# Patient Record
Sex: Female | Born: 1985 | Race: Black or African American | Hispanic: No | Smoking: Never smoker
Health system: Southern US, Community
[De-identification: ages and names within clinical notes are randomized; demographics above are authoritative.]

## PROBLEM LIST (undated history)

## (undated) DIAGNOSIS — D649 Anemia, unspecified: Secondary | ICD-10-CM

## (undated) DIAGNOSIS — R87629 Unspecified abnormal cytological findings in specimens from vagina: Secondary | ICD-10-CM

## (undated) DIAGNOSIS — I10 Essential (primary) hypertension: Secondary | ICD-10-CM

## (undated) DIAGNOSIS — E119 Type 2 diabetes mellitus without complications: Secondary | ICD-10-CM

## (undated) HISTORY — PX: INCISION AND DRAINAGE ABSCESS: SHX5864

## (undated) HISTORY — DX: Unspecified abnormal cytological findings in specimens from vagina: R87.629

## (undated) HISTORY — DX: Essential (primary) hypertension: I10

## (undated) HISTORY — PX: COLPOSCOPY W/ BIOPSY / CURETTAGE: SUR283

## (undated) HISTORY — DX: Anemia, unspecified: D64.9

---

## 2004-08-28 ENCOUNTER — Emergency Department (HOSPITAL_COMMUNITY): Admission: EM | Admit: 2004-08-28 | Discharge: 2004-08-29 | Payer: Self-pay | Admitting: Emergency Medicine

## 2006-08-01 ENCOUNTER — Encounter: Admission: RE | Admit: 2006-08-01 | Discharge: 2006-08-01 | Payer: Self-pay | Admitting: Cardiology

## 2006-12-10 ENCOUNTER — Emergency Department (HOSPITAL_COMMUNITY): Admission: EM | Admit: 2006-12-10 | Discharge: 2006-12-11 | Payer: Self-pay | Admitting: Emergency Medicine

## 2007-07-24 ENCOUNTER — Other Ambulatory Visit: Admission: RE | Admit: 2007-07-24 | Discharge: 2007-07-24 | Payer: Self-pay | Admitting: Gynecology

## 2008-02-26 ENCOUNTER — Ambulatory Visit: Payer: Self-pay | Admitting: Gynecology

## 2008-02-29 ENCOUNTER — Ambulatory Visit: Payer: Self-pay | Admitting: Gynecology

## 2008-03-12 ENCOUNTER — Inpatient Hospital Stay (HOSPITAL_COMMUNITY): Admission: AD | Admit: 2008-03-12 | Discharge: 2008-03-12 | Payer: Self-pay | Admitting: Obstetrics and Gynecology

## 2008-04-04 ENCOUNTER — Emergency Department (HOSPITAL_COMMUNITY): Admission: EM | Admit: 2008-04-04 | Discharge: 2008-04-04 | Payer: Self-pay | Admitting: Emergency Medicine

## 2008-10-03 ENCOUNTER — Encounter (INDEPENDENT_AMBULATORY_CARE_PROVIDER_SITE_OTHER): Payer: Self-pay | Admitting: Obstetrics and Gynecology

## 2008-10-03 ENCOUNTER — Inpatient Hospital Stay (HOSPITAL_COMMUNITY): Admission: RE | Admit: 2008-10-03 | Discharge: 2008-10-06 | Payer: Self-pay | Admitting: Obstetrics and Gynecology

## 2008-10-08 ENCOUNTER — Inpatient Hospital Stay (HOSPITAL_COMMUNITY): Admission: AD | Admit: 2008-10-08 | Discharge: 2008-10-12 | Payer: Self-pay | Admitting: Obstetrics and Gynecology

## 2009-05-06 ENCOUNTER — Ambulatory Visit: Payer: Self-pay | Admitting: Gynecology

## 2009-05-06 ENCOUNTER — Other Ambulatory Visit: Admission: RE | Admit: 2009-05-06 | Discharge: 2009-05-06 | Payer: Self-pay | Admitting: Gynecology

## 2009-06-15 ENCOUNTER — Ambulatory Visit: Payer: Self-pay | Admitting: Gynecology

## 2009-09-09 ENCOUNTER — Emergency Department (HOSPITAL_COMMUNITY): Admission: EM | Admit: 2009-09-09 | Discharge: 2009-09-09 | Payer: Self-pay | Admitting: Emergency Medicine

## 2009-09-17 ENCOUNTER — Ambulatory Visit: Payer: Self-pay | Admitting: Gynecology

## 2009-11-26 ENCOUNTER — Ambulatory Visit: Payer: Self-pay | Admitting: Gynecology

## 2010-05-12 ENCOUNTER — Other Ambulatory Visit
Admission: RE | Admit: 2010-05-12 | Discharge: 2010-05-12 | Payer: Self-pay | Source: Home / Self Care | Admitting: Gynecology

## 2010-05-12 ENCOUNTER — Ambulatory Visit
Admission: RE | Admit: 2010-05-12 | Discharge: 2010-05-12 | Payer: Self-pay | Source: Home / Self Care | Attending: Gynecology | Admitting: Gynecology

## 2010-05-24 ENCOUNTER — Ambulatory Visit: Admit: 2010-05-24 | Payer: Self-pay | Admitting: Gynecology

## 2010-05-31 ENCOUNTER — Ambulatory Visit: Admit: 2010-05-31 | Payer: Self-pay | Admitting: Gynecology

## 2010-06-07 ENCOUNTER — Encounter: Payer: Self-pay | Admitting: Gynecology

## 2010-08-09 LAB — CBC
HCT: 33.5 % — ABNORMAL LOW (ref 36.0–46.0)
HCT: 41.3 % (ref 36.0–46.0)
Hemoglobin: 11.4 g/dL — ABNORMAL LOW (ref 12.0–15.0)
Hemoglobin: 13 g/dL (ref 12.0–15.0)
Hemoglobin: 13.8 g/dL (ref 12.0–15.0)
MCHC: 33.4 g/dL (ref 30.0–36.0)
MCHC: 34 g/dL (ref 30.0–36.0)
MCHC: 34.2 g/dL (ref 30.0–36.0)
MCV: 87.9 fL (ref 78.0–100.0)
MCV: 88.4 fL (ref 78.0–100.0)
MCV: 89.8 fL (ref 78.0–100.0)
Platelets: 140 10*3/uL — ABNORMAL LOW (ref 150–400)
Platelets: 160 10*3/uL (ref 150–400)
RBC: 3.78 MIL/uL — ABNORMAL LOW (ref 3.87–5.11)
RBC: 3.8 MIL/uL — ABNORMAL LOW (ref 3.87–5.11)
RBC: 4.29 MIL/uL (ref 3.87–5.11)
RDW: 14.4 % (ref 11.5–15.5)
RDW: 14.5 % (ref 11.5–15.5)
WBC: 10 10*3/uL (ref 4.0–10.5)
WBC: 5.9 10*3/uL (ref 4.0–10.5)

## 2010-08-09 LAB — MAGNESIUM: Magnesium: 4.1 mg/dL — ABNORMAL HIGH (ref 1.5–2.5)

## 2010-08-09 LAB — DIFFERENTIAL
Basophils Relative: 0 % (ref 0–1)
Eosinophils Absolute: 0.1 10*3/uL (ref 0.0–0.7)
Eosinophils Relative: 2 % (ref 0–5)
Lymphs Abs: 1.2 10*3/uL (ref 0.7–4.0)
Neutro Abs: 4.2 10*3/uL (ref 1.7–7.7)
Neutrophils Relative %: 72 % (ref 43–77)

## 2010-08-09 LAB — COMPREHENSIVE METABOLIC PANEL
BUN: 7 mg/dL (ref 6–23)
CO2: 25 mEq/L (ref 19–32)
Calcium: 7.1 mg/dL — ABNORMAL LOW (ref 8.4–10.5)
Chloride: 108 mEq/L (ref 96–112)
GFR calc Af Amer: >60 mL/min (ref >60)
Glucose, Bld: 81 mg/dL (ref 70–99)
Total Bilirubin: 0.5 mg/dL (ref 0.3–1.2)
Total Protein: 5.6 g/dL — ABNORMAL LOW (ref 6.0–8.3)

## 2010-08-09 LAB — ALT: ALT: 27 U/L (ref 0–35)

## 2010-08-09 LAB — HERPES SIMPLEX VIRUS CULTURE: Culture: NOT DETECTED

## 2010-08-09 LAB — PLATELET COUNT: Platelets: 125 10*3/uL — ABNORMAL LOW (ref 150–400)

## 2010-08-09 LAB — CREATININE, SERUM
Creatinine, Ser: 0.59 mg/dL (ref 0.4–1.2)
Creatinine, Ser: 0.61 mg/dL (ref 0.4–1.2)

## 2010-08-09 LAB — CCBB MATERNAL DONOR DRAW

## 2010-08-09 LAB — URIC ACID: Uric Acid, Serum: 6.8 mg/dL (ref 2.4–7.0)

## 2010-09-14 NOTE — H&P (Signed)
NAMELAKYA, SCHRUPP            ACCOUNT NO.:  [id number]   MEDICAL RECORD NO.:  [id number]          PATIENT TYPE:  INP   LOCATION:  NA                            [facility]  [facility]   PHYSICIAN:  Charles A. Delcambre, MDDATE OF BIRTH:  1986-03-03   DATE OF ADMISSION:  DATE OF DISCHARGE:                              HISTORY & PHYSICAL   HISTORY OF PRESENT ILLNESS:  A 25 year old gravida 1, para 0 at 39 weeks  and 4 days will be admitted secondary to chronic hypertension requiring  2 medications and having elevated blood pressures, but normal pregnancy-  induced hypertension labs.  As she is over 39 weeks, we will commit her  to a serial induction given the patient to try to get her into labor.  This does not preclude that I would send her home and try further, but  with blood pressures increasing and on medication, I am not ready to do  that at this time.  She notes active fetal movement, denies  contractions, ruptured membranes, or bleeding.  She has no scotomata and  right upper quadrant pain, blurred vision or headaches.  Last 24-hour  urine was, as I recall, 228 mg and this was about a week ago.  PIH labs  1 week ago were negative.   PAST MEDICAL HISTORY:  Chronic hypertension, on blood pressure medicines  before the pregnancy.  Otherwise, no medical illnesses.  Baseline  proteinuria was 110 mg.   PAST SURGICAL HISTORY:  None.   MEDICATIONS:  1. Aldomet 500 mg q.i.d.  2. Labetalol 100 mg b.i.d.  3. Prenatal vitamins once a day.   ALLERGIES:  No known drug allergies.   SOCIAL HISTORY:  She does not smoke, drink alcohol, or drug use.  She is  not married.   FAMILY HISTORY:  Unrelated.   PHYSICAL EXAMINATION:  On June 1, she was 299 pounds, afebrile,  respirations 18, pulse 90.  Lungs were clear bilaterally.  Abdomen,  fundal height 42 with morbid obesity noted.  Fetal heart tones 150s on  nonstress test, as she has had biophysical profiles and biweekly  nonstress tests  since 32 weeks, all have been 10/10 and reactive.  She  has cervix posterior and closed.  She had ultrasound recently that  showed 79 percentile growth, 7 pounds 8 ounces, and this was on Sep 22, 2008.   LABORATORY FINDINGS:  A+, antibody screen negative, sickle cell trait  negative, VDRL nonreactive, rubella immune, hepatitis B surface antigen  negative, HIV negative, HIV repeated at 36 weeks negative, GC and  chlamydia early and late both negative, group B strep negative,  hemoglobin 12.3 at 28 weeks, RPR nonreactive at 28 weeks, 1-hour Glucola  99, quad screen and first-trimester screen normal, MSAFP at 16 weeks  only was negative.  RPR was nonreactive, HIV was nonreactive at 36  weeks.   ASSESSMENT:  Chronic hypertension at 39 weeks and 3 days, on multiple  medications.  She used to keep pressure down.   PLAN:  Serial induction, Cytotec versus Cervidil and patience, try to  get her into labor.  She is in  agreement and will report to the hospital  tonight at 7:30.      Charles A. Sydnee Cabal, MD  Electronically Signed     CAD/MEDQ  D:  10/02/2008  T:  10/03/2008  Job:  [id number]

## 2010-09-14 NOTE — Op Note (Signed)
NAMEBRYSTOL, Sara Moore            ACCOUNT NO.:  [id number]   MEDICAL RECORD NO.:  [id number]          PATIENT TYPE:  INP   LOCATION:  9130                          [facility]ILITY:  [facility]   PHYSICIAN:  Charles A. Delcambre, MDDATE OF BIRTH:  Dec 05, 1985   DATE OF PROCEDURE:  10/03/2008  DATE OF DISCHARGE:                               OPERATIVE REPORT   PREOPERATIVE DIAGNOSES:  1. Intrauterine pregnancy 39 weeks and 3 days.  2. Chronic hypertension.  3. Vulvar lesion suspicious for herpes simplex virus.   POSTOPERATIVE DIAGNOSES:  1. Intrauterine pregnancy 39 weeks and 3 days.  2. Chronic hypertension.  3. Vulvar lesion suspicious for herpes simplex virus.   PROCEDURE:  Primary low-transverse cesarean section.   SURGEON:  Charles A. Delcambre, MD   ASSISTANT:  None.   ANESTHESIA:  Epidural.   FINDINGS:  Vigorous female, Apgars 9 and 9.  Cord arterial blood gas pH  7.26.   SPECIMEN:  Placenta to pathology.  Normal uterus and ovaries and tubes  noted.   ESTIMATED BLOOD LOSS:  800 mL.   COMPLICATIONS:  None.   Instrument, sponge, and needle count correct x2.   DESCRIPTION OF PROCEDURE:  The patient was taken to the operating room  and placed in supine position and epidural was dosed and was adequate.  Sterile prep and drape was undertaken.  Pfannenstiel incision was made  with a knife, carried down to fascia.  Fascia was incised with a knife  and Mayo scissors.  The rectus sheath was released superiorly and  inferiorly sharply.  Muscles were separated in the midline by blunt  dissection.  Hemostat was used to enter the peritoneum.  There was no  damage to bowel and bladder or vascular structures and peritoneal  incision was taken superiorly and inferiorly.  Alexis retractor was  placed.  Hand was swept.  No structures were beneath the retractor  superiorly.  The retractor was then rolled down with good exposure.  Vesicouterine peritoneum was incised with Metzenbaum scissors.   Blunt  dissection was used to develop the bladder flap.  The lower uterine  segment was then incised transversally to amniotomy.  Light meconium was  noted and vertical traction was used to extend the incision.  Occiput  was brought to the abdominal opening and was a tight fit.  Bandage  scissors were used to cut beneath the Alexis retractor.  The fascia on  the right approximately a centimeter and this opened up the retractor  enough for the head to come through without difficulty.  Nares and mouth  were suctioned with bulb suction.  There was vigorous cry immediately.  Fundal pressure was applied.  The remainder of the baby was delivered  without difficulty.  Cord was clamped.  Infant was cut free, shown to  the parents, and handed off to the neonatologist in attendance, Dr.  Francine Graven.  Uterus was externalized for repair after the placenta was  manually expressed.  We will  have cord blood drawn.  The uterus was  wiped with a dry lap.  All membranous material was removed.  The uterus  was then closed  in a single layer with #1 chromic one figure-of-eight  suture was used for hemostasis at the right angle and one 2-0 Vicryl  figure-of-eight suture was used in an area of bleeding just below the  incision.  Hemostasis was excellent.  Irrigation was carried out.  Uterus was returned to the abdomen.  Irrigation was carried out again.  Hemostasis was verified.  Alexis retractor was removed.  Subfascial  hemostasis was excellent.  Fascia was then closed with #1 Vicryl running  nonlocking suture.  Subcutaneous hemostasis was excellent after minor  electrocautery.  Irrigation was carried out at this layer as well.  Skin  was then closed with sterile skin clips.  The patient tolerated the  procedure well and was taken to the recovery with physician in  attendance.      Charles A. Sydnee Cabal, MD  Electronically Signed     CAD/MEDQ  D:  10/03/2008  T:  10/04/2008  Job:  [id number]

## 2010-09-17 NOTE — Discharge Summary (Signed)
Sara Moore, Sara Moore            ACCOUNT NO.:  [id number]   MEDICAL RECORD NO.:  [id number]          PATIENT TYPE:  INP   LOCATION:  9130                          [facility]ILITY[facility]  [facility]   PHYSICIAN:  Charles A. Delcambre, MDDATE OF BIRTH:  1985-12-27   DATE OF ADMISSION:  10/03/2008  DATE OF DISCHARGE:  10/06/2008                               DISCHARGE SUMMARY   PRIMARY DISCHARGE DIAGNOSES:  1. Intrauterine pregnancy at 39 weeks and 3 days.  2. Chronic hypertension.  3. Vulvar lesion, possible herpes simplex virus outbreak.   POSTOPERATIVE DIAGNOSES:  1. Intrauterine pregnancy at 39 weeks and 3 days.  2. Chronic hypertension.  3. Vulvar lesion, possible herpes simplex virus outbreak.   PROCEDURE:  Primary low transverse cesarean section.   SURGEON:  Charles A. Delcambre, MD   ASSISTANT:  None.   OPERATIVE FINDINGS:  Vigorous female, Apgars 9 and 9, cord arterial  blood gas 7.26, placenta to Pathology.  Baby was 52.7 cm, 3570 g, vulvar  lesion culture ultimately returned, negative for HSV.  Placenta  pathology is not on chart at this time.   LABORATORY:  Preoperative hematocrit 41.3, hemoglobin 13.8, and  platelets 140.  Postoperative hematocrit 38.1 and hemoglobin 13.0.  Pregnancy-induced hypertension.  Labs were negative with ALT 21, LDH  138, AST 26, and creatinine 0.59.   HISTORY AND PHYSICAL:  Dictated on the chart.   HOSPITAL COURSE:  The patient was admitted for induction secondary to  worsening chronic hypertension with the patient have to be on Aldomet  and labetalol to control hypertension.  During the course of labor,  there was noted that she had a vulvar lesion and had a history of such  recurrent lesions, although she denied that this was a herpes lesion.  She had not been cultured for it in the past.  Therefore, at the  notation that this was possible herpes lesion.  I recommended cesarean  section.  She gave informed consent and cesarean section was  undertaken,  findings were noted above.  Postoperatively, she did well with  hypertension exacerbating somewhat and she had to be raised to 200 mg  labetalol b.i.d. in addition to the Aldomet 500 q.i.d.  She had no  headache, scotoma, or right upper quadrant pain and had routine  postoperative course, otherwise flatus returned on postop day #1.  Blood  pressure addressed further on day 2 with blood pressure 160/100,  continued to do well and no pressure stable 130/80s to 150/90s.  Known  she was chronic hypertension and known she was proteinuric with baseline  protein not in a severe range.  She was discharged home to follow up in the office in 2-3 days to  staples discontinued.   DISPOSITION:  She was given convalescent instructions, prescription of  Percocet 5/325 one to two p.o. q.4 h. p.r.n., #40 and Motrin 800 mg 1  p.o. q.8 h. p.r.n., #30, refill x1 were given with instructions no  lifting greater 25  pounds for 1 month.  Shower okay for 2 weeks.  No driving for 2 weeks.  No intercourse for 6 weeks, nothing in the vagina  for 6 weeks.  Notify  if temperature greater than 100 degrees, headache, scotoma, right upper  quadrant pain, erythema about the incision, pus draining, heavy  bleeding, or increased abdominal pain.      Charles A. Sydnee Cabal, MD  Electronically Signed     CAD/MEDQ  D:  10/25/2008  T:  10/25/2008  Job:  [id number]

## 2010-09-17 NOTE — Discharge Summary (Signed)
Sara Moore, Sara Moore            ACCOUNT NO.:  [id number]   MEDICAL RECORD NO.:  [id number]          PATIENT TYPE:  INP   LOCATION:  9373                          [facility]ILITY[facility]   PHYSICIAN:  Charles A. Delcambre, MDDATE OF BIRTH:  01/08/1986   DATE OF ADMISSION:  10/08/2008  DATE OF DISCHARGE:  10/12/2008                               DISCHARGE SUMMARY   PRIMARY DISCHARGE DIAGNOSES:  Pregnancy-augmented hypertension versus  severe preeclampsia.   PROCEDURE:  Admission to the AICU, given magnesium prophylaxis for 24  hours, management of hypertension, otherwise full rest hospitalization.   HISTORY AND PHYSICAL:  As dictated on the chart.   HOSPITAL COURSE:  The patient was admitted.  At the time of admission 5,  noted to have abnormal labs at the office with blood pressure  increasing, AST was 48 with normal up to 41, ALT was normal, uric acid  was 8.3, creatinine 0.7, platelets over 185.  She was admitted to  undergo magnesium prophylaxis for 24 hours.  She did undergo 4 g bolus  and then 2 g per hour IV mag, mag level was 4.7, it had been 4.1 earlier  in the evening.  She had blood pressures in the 150s/80-90s occasional  higher, max blood pressure was 174/110.  Urine output was 500, intake  was 500 since midnight.  On postop day #1, she had some crackles in her  lungs, right base, was breathing okay, sounds were okay, greater than 92  on 2 L per minute nasal cannula.  Repeat SGOT is 35, SGPT 33, creatinine  0.6, platelets 160.  She had been given 4 doses of IV labetalol 20 mg on  during the night to control her pressure.  She was increased to 400 mg  b.i.d. of labetalol in addition to the Aldomet 500 mg q.6 h.  She was  observed one more night in the ICU to control blood pressure.  Overnight, she was not given any IV labetalol.  I started Procardia XL  30 mg and discontinued the Aldomet, continued labetalol 400 mg, blood  pressures 161/87, 159/94.  Dr. Richardson Dopp saw the patient in  my absence and  increased Procardia ultimately to 90 mg, blood pressures were 179/91,  147/81.  On hospital day #3 postop day 9, she was discharged home on 90  mg of Procardia XL plus 400 mg labetalol b.i.d., the Procardia was once  a day.  She was to return to the office in 4-5 days to check blood  pressure once again, as blood pressures on this dose were 130s-150s over  60s-99.   DISPOSITION:  Return in 4-5 days after discharge home.  Per blood  pressure check, medications including Percocet and Motrin that she  already had plus labetalol that she already had, but increased to 400 mg  b.i.d. and a new prescription of Procardia XL 90 mg once a day #30.      Charles A. Sydnee Cabal, MD  Electronically Signed     CAD/MEDQ  D:  10/25/2008  T:  10/25/2008  Job:  [id number]

## 2011-07-07 ENCOUNTER — Encounter: Payer: Self-pay | Admitting: Gynecology

## 2013-01-31 ENCOUNTER — Ambulatory Visit: Payer: Self-pay | Admitting: Family Medicine

## 2013-02-04 ENCOUNTER — Ambulatory Visit: Payer: Self-pay

## 2013-02-14 ENCOUNTER — Ambulatory Visit: Payer: Self-pay | Admitting: Internal Medicine

## 2013-03-04 ENCOUNTER — Encounter: Payer: Self-pay | Admitting: Internal Medicine

## 2013-03-04 ENCOUNTER — Ambulatory Visit: Payer: Medicaid Other | Attending: Internal Medicine | Admitting: Internal Medicine

## 2013-03-04 ENCOUNTER — Other Ambulatory Visit (HOSPITAL_COMMUNITY)
Admission: RE | Admit: 2013-03-04 | Discharge: 2013-03-04 | Disposition: A | Discharge status: Home / Self Care | Payer: Medicaid Other | Source: Ambulatory Visit | Attending: Internal Medicine | Admitting: Internal Medicine

## 2013-03-04 VITALS — BP 150/90 | HR 129 | Temp 99.4°F | Resp 16 | Ht 68.0 in | Wt 303.0 lb

## 2013-03-04 DIAGNOSIS — I10 Essential (primary) hypertension: Secondary | ICD-10-CM | POA: Insufficient documentation

## 2013-03-04 DIAGNOSIS — N760 Acute vaginitis: Secondary | ICD-10-CM | POA: Insufficient documentation

## 2013-03-04 DIAGNOSIS — E785 Hyperlipidemia, unspecified: Secondary | ICD-10-CM | POA: Insufficient documentation

## 2013-03-04 DIAGNOSIS — N898 Other specified noninflammatory disorders of vagina: Secondary | ICD-10-CM | POA: Insufficient documentation

## 2013-03-04 DIAGNOSIS — Z01419 Encounter for gynecological examination (general) (routine) without abnormal findings: Secondary | ICD-10-CM | POA: Insufficient documentation

## 2013-03-04 DIAGNOSIS — Z124 Encounter for screening for malignant neoplasm of cervix: Secondary | ICD-10-CM | POA: Insufficient documentation

## 2013-03-04 DIAGNOSIS — Z113 Encounter for screening for infections with a predominantly sexual mode of transmission: Secondary | ICD-10-CM | POA: Insufficient documentation

## 2013-03-04 DIAGNOSIS — R1032 Left lower quadrant pain: Secondary | ICD-10-CM

## 2013-03-04 LAB — CMP AND LIVER
ALT: 15 U/L (ref 0–35)
AST: 14 U/L (ref 0–37)
Albumin: 3.9 g/dL (ref 3.5–5.2)
Alkaline Phosphatase: 38 U/L — ABNORMAL LOW (ref 39–117)
Glucose, Bld: 87 mg/dL (ref 70–99)
Potassium: 3.8 mEq/L (ref 3.5–5.3)
Sodium: 143 mEq/L (ref 135–145)
Total Protein: 6.9 g/dL (ref 6.0–8.3)

## 2013-03-04 LAB — CBC WITH DIFFERENTIAL/PLATELET
Basophils Absolute: 0 10*3/uL (ref 0.0–0.1)
Basophils Relative: 0 % (ref 0–1)
Eosinophils Relative: 2 % (ref 0–5)
HCT: 33.1 % — ABNORMAL LOW (ref 36.0–46.0)
MCH: 25.8 pg — ABNORMAL LOW (ref 26.0–34.0)
MCHC: 32.3 g/dL (ref 30.0–36.0)
MCV: 80 fL (ref 78.0–100.0)
Monocytes Absolute: 0.6 10*3/uL (ref 0.1–1.0)
RDW: 15.8 % — ABNORMAL HIGH (ref 11.5–15.5)

## 2013-03-04 LAB — LIPID PANEL
LDL Cholesterol: 107 mg/dL — ABNORMAL HIGH (ref 0–99)
VLDL: 21 mg/dL (ref 0–40)

## 2013-03-04 LAB — TSH: TSH: 0.359 u[IU]/mL (ref 0.350–4.500)

## 2013-03-04 NOTE — Progress Notes (Signed)
Patient ID: Sara Moore, female   DOB: Mar 06, 1986, 27 y.o.   MRN: [id number] Patient Demographics  Sara Moore, is a 27 y.o. female  WUJ:[id number]  NFA:[id number]  DOB - 08/29/1985  CC:  Chief Complaint  Patient presents with  . Establish Care       HPI: Sara Moore is a 27 y.o. female here today to establish medical care. She has no significant past medical history. Major complaint today is vaginal discharge and lower abdominal pain and heaviness. She is not known to have hypertension or diabetes but it is family history of both. She is married, has a daughter 60 years old. She does not smoke cigarette, she does not drink alcohol. But she smokes marijuana daily.  Patient has No headache, No chest pain, - No Nausea, No new weakness tingling or numbness, No Cough - SOB.  No Known Allergies History reviewed. No pertinent past medical history. No current outpatient prescriptions on file prior to visit.   No current facility-administered medications on file prior to visit.   Family History  Problem Relation Age of Onset  . Hypertension Mother   . Diabetes Father   . Hypertension Father    History   Social History  . Marital Status: Single    Spouse Name: N/A    Number of Children: N/A  . Years of Education: N/A   Occupational History  . Not on file.   Social History Main Topics  . Smoking status: Never Smoker   . Smokeless tobacco: Not on file  . Alcohol Use: No  . Drug Use: 7.00 per week    Special: Marijuana  . Sexual Activity: Not on file   Other Topics Concern  . Not on file   Social History Narrative  . No narrative on file    Review of Systems: Constitutional: Negative for fever, chills, diaphoresis, activity change, appetite change and fatigue. HENT: Negative for ear pain, nosebleeds, congestion, facial swelling, rhinorrhea, neck pain, neck stiffness and ear discharge.  Eyes: Negative for pain, discharge, redness, itching and visual  disturbance. Respiratory: Negative for cough, choking, chest tightness, shortness of breath, wheezing and stridor.  Cardiovascular: Negative for chest pain, palpitations and leg swelling. Gastrointestinal: Negative for abdominal distention. Genitourinary: Negative for dysuria, urgency, frequency, hematuria, flank pain, decreased urine volume, difficulty urinating and dyspareunia.  Musculoskeletal: Negative for back pain, joint swelling, arthralgia and gait problem. Neurological: Negative for dizziness, tremors, seizures, syncope, facial asymmetry, speech difficulty, weakness, light-headedness, numbness and headaches.  Hematological: Negative for adenopathy. Does not bruise/bleed easily. Psychiatric/Behavioral: Negative for hallucinations, behavioral problems, confusion, dysphoric mood, decreased concentration and agitation.    Objective:   Filed Vitals:   03/04/13 1418  BP: 161/107  Pulse: 129  Temp: 99.4 F (37.4 C)  Resp: 16    Physical Exam: Constitutional: Patient appears well-developed and well-nourished. No distress. Morbidly obese HENT: Normocephalic, atraumatic, External right and left ear normal. Oropharynx is clear and moist.  Eyes: Conjunctivae and EOM are normal. PERRLA, no scleral icterus. Neck: Normal ROM. Neck supple. No JVD. No tracheal deviation. No thyromegaly. CVS: RRR, S1/S2 +, no murmurs, no gallops, no carotid bruit.  Pulmonary: Effort and breath sounds normal, no stridor, rhonchi, wheezes, rales.  Abdominal: Soft. BS +, no distension, tenderness, rebound or guarding.  Musculoskeletal: Normal range of motion. No edema and no tenderness.  Lymphadenopathy: No lymphadenopathy noted, cervical, inguinal or axillary Neuro: Alert. Normal reflexes, muscle tone coordination. No cranial nerve deficit. Skin: Skin is warm and dry. No  rash noted. Not diaphoretic. No erythema. No pallor. Psychiatric: Normal mood and affect. Behavior, judgment, thought content normal. Pelvic  exam: Normal female external genitalia, copious vaginal discharge, foul smelling, central cervix, cervical excitation tenderness negative  Lab Results  Component Value Date   WBC 6.6 10/10/2008   HGB 11.4* 10/10/2008   HCT 33.4* 10/10/2008   MCV 88.4 10/10/2008   PLT 184 10/10/2008   Lab Results  Component Value Date   CREATININE 0.61 10/10/2008   BUN 7 10/09/2008   NA 139 10/09/2008   K 3.3* 10/09/2008   CL 108 10/09/2008   CO2 25 10/09/2008    No results found for this basename: HGBA1C   Lipid Panel  No results found for this basename: chol, trig, hdl, cholhdl, vldl, ldlcalc       Assessment and plan:   Patient Active Problem List   Diagnosis Date Noted  . Vaginal discharge 03/04/2013  . Pap smear for cervical cancer screening 03/04/2013  . Accelerated hypertension 03/04/2013  . Dyslipidemia 03/04/2013  . Morbid obesity 03/04/2013  . Abdominal pain, left lower quadrant 03/04/2013   Repeat blood pressure today was 150/90.  Plan: Patient is not known to have hypertension, she has been instructed to keep a log of ambulatory blood pressure and bring to clinic in one to 2 weeks, then we will decide on antihypertensive. Otherwise she was counseled on nutrition and exercise   Pap smear done, sent for cytology, culture and wet mount Pelvic and transvaginal ultrasound ordered  Labs: CBC D. CMP TSH and free T4 Lipid panel Complete urinalysis Hemoglobin A1c  Follow up in 3 months or when necessary , but return in 2 weeks for blood pressure check   The patient was given clear instructions to go to ER or return to medical center if symptoms don't improve, worsen or new problems develop. The patient verbalized understanding. The patient was told to call to get lab results if they haven't heard anything in the next week.     Jeanann Lewandowsky, MD, MHA, FACP, FAAP [facility] [place], [place] [phone]   03/04/2013, 3:09 PM

## 2013-03-04 NOTE — Patient Instructions (Signed)
Obesity Obesity is defined as having too much total body fat and a body mass index (BMI) of 30 or more. BMI is an estimate of body fat and is calculated from your height and weight. Obesity happens when you consume more calories than you can burn by exercising or performing daily physical tasks. Prolonged obesity can cause major illnesses or emergencies, such as:   A stroke.  Heart disease.  Diabetes.  Cancer.  Arthritis.  High blood pressure (hypertension).  High cholesterol.  Sleep apnea.  Erectile dysfunction.  Infertility problems. CAUSES   Regularly eating unhealthy foods.  Physical inactivity.  Certain disorders, such as an underactive thyroid (hypothyroidism), Cushing's syndrome, and polycystic ovarian syndrome.  Certain medicines, such as steroids, some depression medicines, and antipsychotics.  Genetics.  Lack of sleep. DIAGNOSIS  A caregiver can diagnose obesity after calculating your BMI. Obesity will be diagnosed if your BMI is 30 or higher.  There are other methods of measuring obesity levels. Some other methods include measuring your skin fold thickness, your waist circumference, and comparing your hip circumference to your waist circumference. TREATMENT  A healthy treatment program includes some or all of the following:  Long-term dietary changes.  Exercise and physical activity.  Behavioral and lifestyle changes.  Medicine only under the supervision of your caregiver. Medicines may help, but only if they are used with diet and exercise programs. An unhealthy treatment program includes:  Fasting.  Fad diets.  Supplements and drugs. These choices do not succeed in long-term weight control.  HOME CARE INSTRUCTIONS   Exercise and perform physical activity as directed by your caregiver. To increase physical activity, try the following:  Use stairs instead of elevators.  Park farther away from store entrances.  Garden, bike, or walk instead of  watching television or using the computer.  Eat healthy, low-calorie foods and drinks on a regular basis. Eat more fruits and vegetables. Use low-calorie cookbooks or take healthy cooking classes.  Limit fast food, sweets, and processed snack foods.  Eat smaller portions.  Keep a daily journal of everything you eat. There are many free websites to help you with this. It may be helpful to measure your foods so you can determine if you are eating the correct portion sizes.  Avoid drinking alcohol. Drink more water and drinks without calories.  Take vitamins and supplements only as recommended by your caregiver.  Weight-loss support groups, Optometrist, counselors, and stress reduction education can also be very helpful. SEEK IMMEDIATE MEDICAL CARE IF:  You have chest pain or tightness.  You have trouble breathing or feel short of breath.  You have weakness or leg numbness.  You feel confused or have trouble talking.  You have sudden changes in your vision. MAKE SURE YOU:  Understand these instructions.  Will watch your condition.  Will get help right away if you are not doing well or get worse. Document Released: 05/26/2004 Document Revised: 10/18/2011 Document Reviewed: 05/25/2011 [facility] Patient Information 2014 [place], [place]. DASH Diet The DASH diet stands for "Dietary Approaches to Stop Hypertension." It is a healthy eating plan that has been shown to reduce high blood pressure (hypertension) in as little as 14 days, while also possibly providing other significant health benefits. These other health benefits include reducing the risk of breast cancer after menopause and reducing the risk of type 2 diabetes, heart disease, colon cancer, and stroke. Health benefits also include weight loss and slowing kidney failure in patients with chronic kidney disease.  DIET GUIDELINES  Limit salt (sodium). Your diet should contain less than 1500 mg of sodium daily.  Limit  refined or processed carbohydrates. Your diet should include mostly whole grains. Desserts and added sugars should be used sparingly.  Include small amounts of heart-healthy fats. These types of fats include nuts, oils, and tub margarine. Limit saturated and trans fats. These fats have been shown to be harmful in the body. CHOOSING FOODS  The following food groups are based on a 2000 calorie diet. See your Registered Dietitian for individual calorie needs. Grains and Grain Products (6 to 8 servings daily)  Eat More Often: Whole-wheat bread, brown rice, whole-grain or wheat pasta, quinoa, popcorn without added fat or salt (air popped).  Eat Less Often: White bread, white pasta, white rice, cornbread. Vegetables (4 to 5 servings daily)  Eat More Often: Fresh, frozen, and canned vegetables. Vegetables may be raw, steamed, roasted, or grilled with a minimal amount of fat.  Eat Less Often/Avoid: Creamed or fried vegetables. Vegetables in a cheese sauce. Fruit (4 to 5 servings daily)  Eat More Often: All fresh, canned (in natural juice), or frozen fruits. Dried fruits without added sugar. One hundred percent fruit juice ( cup [237 mL] daily).  Eat Less Often: Dried fruits with added sugar. Canned fruit in light or heavy syrup. [employer], Fish, and Poultry (2 servings or less daily. One serving is 3 to 4 oz [85-114 g]).  Eat More Often: Ninety percent or leaner ground beef, tenderloin, sirloin. Round cuts of beef, chicken breast, [place] breast. All fish. Grill, bake, or broil your meat. Nothing should be fried.  Eat Less Often/Avoid: Fatty cuts of meat, [place], or chicken leg, thigh, or wing. Fried cuts of meat or fish. Dairy (2 to 3 servings)  Eat More Often: Low-fat or fat-free milk, low-fat plain or light yogurt, reduced-fat or part-skim cheese.  Eat Less Often/Avoid: Milk (whole, 2%).Whole milk yogurt. Full-fat cheeses. Nuts, Seeds, and Legumes (4 to 5 servings per week)  Eat More  Often: All without added salt.  Eat Less Often/Avoid: Salted nuts and seeds, canned beans with added salt. Fats and Sweets (limited)  Eat More Often: Vegetable oils, tub margarines without trans fats, sugar-free gelatin. Mayonnaise and salad dressings.  Eat Less Often/Avoid: Coconut oils, palm oils, butter, stick margarine, cream, half and half, cookies, candy, pie. FOR MORE INFORMATION The Dash Diet Eating Plan: www.dashdiet.org Document Released: 04/07/2011 Document Revised: 07/11/2011 Document Reviewed: 04/07/2011 [facility] Patient Information 2014 [place], [place]. Hypertension As your heart beats, it forces blood through your arteries. This force is your blood pressure. If the pressure is too high, it is called hypertension (HTN) or high blood pressure. HTN is dangerous because you may have it and not know it. High blood pressure may mean that your heart has to work harder to pump blood. Your arteries may be narrow or stiff. The extra work puts you at risk for heart disease, stroke, and other problems.  Blood pressure consists of two numbers, a higher number over a lower, 110/72, for example. It is stated as "110 over 72." The ideal is below 120 for the top number (systolic) and under 80 for the bottom (diastolic). Write down your blood pressure today. You should pay close attention to your blood pressure if you have certain conditions such as:  Heart failure.  Prior heart attack.  Diabetes  Chronic kidney disease.  Prior stroke.  Multiple risk factors for heart disease. To see if you have HTN, your blood pressure should be  measured while you are seated with your arm held at the level of the heart. It should be measured at least twice. A one-time elevated blood pressure reading (especially in the Emergency Department) does not mean that you need treatment. There may be conditions in which the blood pressure is different between your right and left arms. It is important to see your  caregiver soon for a recheck. Most people have essential hypertension which means that there is not a specific cause. This type of high blood pressure may be lowered by changing lifestyle factors such as:  Stress.  Smoking.  Lack of exercise.  Excessive weight.  Drug/tobacco/alcohol use.  Eating less salt. Most people do not have symptoms from high blood pressure until it has caused damage to the body. Effective treatment can often prevent, delay or reduce that damage. TREATMENT  When a cause has been identified, treatment for high blood pressure is directed at the cause. There are a large number of medications to treat HTN. These fall into several categories, and your caregiver will help you select the medicines that are best for you. Medications may have side effects. You should review side effects with your caregiver. If your blood pressure stays high after you have made lifestyle changes or started on medicines,   Your medication(s) may need to be changed.  Other problems may need to be addressed.  Be certain you understand your prescriptions, and know how and when to take your medicine.  Be sure to follow up with your caregiver within the time frame advised (usually within two weeks) to have your blood pressure rechecked and to review your medications.  If you are taking more than one medicine to lower your blood pressure, make sure you know how and at what times they should be taken. Taking two medicines at the same time can result in blood pressure that is too low. SEEK IMMEDIATE MEDICAL CARE IF:  You develop a severe headache, blurred or changing vision, or confusion.  You have unusual weakness or numbness, or a faint feeling.  You have severe chest or abdominal pain, vomiting, or breathing problems. MAKE SURE YOU:   Understand these instructions.  Will watch your condition.  Will get help right away if you are not doing well or get worse. Document Released:  04/18/2005 Document Revised: 07/11/2011 Document Reviewed: 12/07/2007 [facility] Patient Information 2014 [place], [place]. Exercise to Lose Weight Exercise and a healthy diet may help you lose weight. Your doctor may suggest specific exercises. EXERCISE IDEAS AND TIPS  Choose low-cost things you enjoy doing, such as walking, bicycling, or exercising to workout videos.  Take stairs instead of the elevator.  Walk during your lunch break.  Park your car further away from work or school.  Go to a gym or an exercise class.  Start with 5 to 10 minutes of exercise each day. Build up to 30 minutes of exercise 4 to 6 days a week.  Wear shoes with good support and comfortable clothes.  Stretch before and after working out.  Work out until you breathe harder and your heart beats faster.  Drink extra water when you exercise.  Do not do so much that you hurt yourself, feel dizzy, or get very short of breath. Exercises that burn about 150 calories:  Running 1  miles in 15 minutes.  Playing volleyball for 45 to 60 minutes.  Washing and waxing a car for 45 to 60 minutes.  Playing touch football for 45 minutes.  Walking  1  miles in 35 minutes.  Pushing a stroller 1  miles in 30 minutes.  Playing basketball for 30 minutes.  Raking leaves for 30 minutes.  Bicycling 5 miles in 30 minutes.  Walking 2 miles in 30 minutes.  Dancing for 30 minutes.  Shoveling snow for 15 minutes.  Swimming laps for 20 minutes.  Walking up stairs for 15 minutes.  Bicycling 4 miles in 15 minutes.  Gardening for 30 to 45 minutes.  Jumping rope for 15 minutes.  Washing windows or floors for 45 to 60 minutes. Document Released: 05/21/2010 Document Revised: 07/11/2011 Document Reviewed: 05/21/2010 [facility] Patient Information 2014 [place], [place].

## 2013-03-04 NOTE — Progress Notes (Signed)
Pt is here to establish care. For over a month pt has been having stomach pains. Some constipation but regular and consistent stool.  For 4 months pt is having discharge, creamy, yellow, clear, with an odor.

## 2013-03-05 LAB — URINALYSIS, COMPLETE
Bilirubin Urine: NEGATIVE
Hgb urine dipstick: NEGATIVE
Leukocytes, UA: NEGATIVE
Protein, ur: NEGATIVE mg/dL
Urobilinogen, UA: 0.2 mg/dL (ref 0.0–1.0)

## 2013-03-06 ENCOUNTER — Ambulatory Visit (HOSPITAL_COMMUNITY)
Admission: RE | Admit: 2013-03-06 | Discharge: 2013-03-06 | Disposition: A | Discharge status: Home / Self Care | Payer: Medicaid Other | Source: Ambulatory Visit | Attending: Internal Medicine | Admitting: Internal Medicine

## 2013-03-06 DIAGNOSIS — N898 Other specified noninflammatory disorders of vagina: Secondary | ICD-10-CM | POA: Insufficient documentation

## 2013-03-06 DIAGNOSIS — R1032 Left lower quadrant pain: Secondary | ICD-10-CM | POA: Insufficient documentation

## 2013-03-06 DIAGNOSIS — N83209 Unspecified ovarian cyst, unspecified side: Secondary | ICD-10-CM | POA: Insufficient documentation

## 2013-03-08 ENCOUNTER — Other Ambulatory Visit: Payer: Self-pay | Admitting: Internal Medicine

## 2013-03-08 ENCOUNTER — Telehealth: Payer: Self-pay | Admitting: Emergency Medicine

## 2013-03-08 DIAGNOSIS — N83209 Unspecified ovarian cyst, unspecified side: Secondary | ICD-10-CM

## 2013-03-08 MED ORDER — METRONIDAZOLE 500 MG PO TABS: ORAL_TABLET | ORAL | Status: DC

## 2013-03-08 NOTE — Telephone Encounter (Signed)
Message copied by Darlis Loan on Fri Mar 08, 2013  2:17 PM ------      Message from: Quentin Angst      Created: Fri Mar 08, 2013 12:27 PM       Please inform patient that her ultrasound shows Bilateral complex/ hemorrhagic ovarian cyst. Normal uterus and endometrium.. We will refer her to a gynecologist for further evaluation and management       ------

## 2013-03-08 NOTE — Telephone Encounter (Signed)
Pt given u/s results. Pt also prescribed Flagyl 500 mg bid x 7 dys with treatment for spouse. Will refer to gynecology

## 2013-03-15 ENCOUNTER — Telehealth: Payer: Self-pay | Admitting: Emergency Medicine

## 2013-03-15 NOTE — Telephone Encounter (Signed)
Message copied by Darlis Loan on Fri Mar 15, 2013  6:41 PM ------      Message from: Quentin Angst      Created: Fri Mar 15, 2013  4:33 PM       Please tell patient that her hemoglobin is slightly low. Recommend over-the-counter iron supplement. Patient already know the result of her Pap smear been normal and a vaginal swab test being positive for Trichomonas and Gardnerella, treated with Flagyl. ------

## 2013-03-15 NOTE — Telephone Encounter (Signed)
Pt called with results and told to complete prescribed medication

## 2013-03-20 ENCOUNTER — Telehealth: Payer: Self-pay | Admitting: Internal Medicine

## 2013-03-20 NOTE — Telephone Encounter (Signed)
Patient lost one pill. She was wondering if it was okay to miss a dose?

## 2013-04-04 ENCOUNTER — Ambulatory Visit: Payer: Medicaid Other

## 2013-06-10 ENCOUNTER — Telehealth: Payer: Self-pay

## 2013-06-10 NOTE — Telephone Encounter (Signed)
Pt. Called stating she has been waiting to be seen for a cyst since October and wants to know when she has an appointment. Called pt. And left message informing her of appointment scheduled for this Friday 06/14/13 at 1030. Pt. Can call clinic if questions.

## 2013-06-11 ENCOUNTER — Telehealth: Payer: Self-pay | Admitting: *Deleted

## 2013-06-11 NOTE — Telephone Encounter (Signed)
Pt called nurse line requesting information on when she would get an appointment to be seen for bleeding, cyst on ovary,.

## 2013-06-13 ENCOUNTER — Ambulatory Visit (HOSPITAL_COMMUNITY): Payer: Medicaid Other

## 2013-06-13 ENCOUNTER — Telehealth: Payer: Self-pay | Admitting: *Deleted

## 2013-06-13 DIAGNOSIS — N83209 Unspecified ovarian cyst, unspecified side: Secondary | ICD-10-CM

## 2013-06-13 NOTE — Telephone Encounter (Signed)
Attempted to call patient. No lines operating, pt scheduled for ultrasound on 02/13 at 1015, then will schedule f/u appt.  Appts set up for patient.

## 2013-06-13 NOTE — Telephone Encounter (Signed)
Called Sara Moore and notified her of appointments for ultrasound and follow up/results.  Sara Moore voices understanding.

## 2013-06-13 NOTE — Telephone Encounter (Signed)
Message copied by Dorothyann PengHAIZLIP, Josilynn E on Thu Jun 13, 2013 11:25 AM ------      Message from: Adam PhenixARNOLD, JAMES G      Created: Tue Jun 11, 2013  5:04 PM       Referred for f/u US 03/2003 hemorrhagic ovarian cysts. Recommend f/u pelvic US be done prior to being seen in gyn clinic, may reschedule appt if needed  ------

## 2013-06-13 NOTE — Telephone Encounter (Signed)
Message copied by Dorothyann PengHAIZLIP, Marka E on Thu Jun 13, 2013  8:23 AM ------      Message from: Adam PhenixARNOLD, JAMES G      Created: Tue Jun 11, 2013  5:04 PM       Referred for f/u US 03/2003 hemorrhagic ovarian cysts. Recommend f/u pelvic US be done prior to being seen in gyn clinic, may reschedule appt if needed  ------

## 2013-06-14 ENCOUNTER — Encounter: Payer: Medicaid Other | Admitting: Obstetrics & Gynecology

## 2013-06-14 ENCOUNTER — Other Ambulatory Visit: Payer: Self-pay | Admitting: Obstetrics & Gynecology

## 2013-06-14 ENCOUNTER — Ambulatory Visit (HOSPITAL_COMMUNITY)
Admission: RE | Admit: 2013-06-14 | Discharge: 2013-06-14 | Disposition: A | Discharge status: Home / Self Care | Payer: Medicaid Other | Source: Ambulatory Visit | Attending: Obstetrics & Gynecology | Admitting: Obstetrics & Gynecology

## 2013-06-14 DIAGNOSIS — N949 Unspecified condition associated with female genital organs and menstrual cycle: Secondary | ICD-10-CM | POA: Insufficient documentation

## 2013-06-14 DIAGNOSIS — N83209 Unspecified ovarian cyst, unspecified side: Secondary | ICD-10-CM

## 2013-06-14 DIAGNOSIS — N831 Corpus luteum cyst of ovary, unspecified side: Secondary | ICD-10-CM | POA: Insufficient documentation

## 2013-06-17 ENCOUNTER — Telehealth: Payer: Self-pay

## 2013-06-17 NOTE — Telephone Encounter (Signed)
Message copied by Louanna RawAMPBELL, Avian Greenawalt M on Mon Jun 17, 2013 10:32 AM ------      Message from: Adam PhenixARNOLD, JAMES G      Created: Fri Jun 14, 2013 12:10 PM       Normal US has clinic appt, notify patient normal result ------

## 2013-06-17 NOTE — Telephone Encounter (Signed)
Pt. Informed. Reminded of appointment date and time. Pt. Had no questions or concerns.

## 2013-07-08 ENCOUNTER — Encounter: Payer: Self-pay | Admitting: Obstetrics & Gynecology

## 2013-07-08 ENCOUNTER — Ambulatory Visit (INDEPENDENT_AMBULATORY_CARE_PROVIDER_SITE_OTHER): Payer: Medicaid Other | Admitting: Obstetrics & Gynecology

## 2013-07-08 VITALS — BP 142/98 | HR 88 | Temp 98.3°F | Ht 60.0 in | Wt 300.5 lb

## 2013-07-08 DIAGNOSIS — N83209 Unspecified ovarian cyst, unspecified side: Secondary | ICD-10-CM

## 2013-07-08 NOTE — Progress Notes (Signed)
Patient ID: Sara SavageCandace S Moore, female   DOB: 07/28/1985, 28 y.o.   MRN: [id number]  Chief Complaint  Patient presents with  . Follow-up    discuss ultrasound results from 2/13    HPI Sara Moore is a 28 y.o. female.  [id number] Patient's last menstrual period was 06/22/2013. Referred for f/u of hemorrhagic ovarian cyst, US done 06/14/13  HPI  History reviewed. No pertinent past medical history.  Past Surgical History  Procedure Laterality Date  . Cesarean section      Family History  Problem Relation Age of Onset  . Hypertension Mother   . Diabetes Father   . Hypertension Father     Social History History  Substance Use Topics  . Smoking status: Never Smoker   . Smokeless tobacco: Never Used  . Alcohol Use: No    No Known Allergies  No current outpatient prescriptions on file.   No current facility-administered medications for this visit.    Review of Systems Review of Systems  Constitutional: Negative.   Genitourinary: Positive for menstrual problem (last for 7 days). Negative for vaginal bleeding, vaginal discharge and pelvic pain.    Blood pressure 142/98, pulse 88, temperature 98.3 F (36.8 C), temperature source Oral, height 5' (1.524 m), weight 300 lb 8 oz (136.306 kg), last menstrual period 06/22/2013.  Physical Exam Physical Exam  Constitutional: She is oriented to person, place, and time. She appears well-developed. No distress.  Neurological: She is alert and oriented to person, place, and time.  Skin: Skin is warm.  Psychiatric: She has a normal mood and affect. Her behavior is normal.    Data Reviewed Study Result    CLINICAL DATA: Pelvic pain, follow-up ovarian cyst  EXAM:  TRANSVAGINAL ULTRASOUND OF PELVIS  TECHNIQUE:  Transvaginal ultrasound examination of the pelvis was performed  including evaluation of the uterus, ovaries, adnexal regions, and  pelvic cul-de-sac.  COMPARISON: 03/06/2013  FINDINGS:  Uterus  Measurements: 8.2 x  4.4 x 5.6 cm. No fibroids or other mass  visualized.  Endometrium  Thickness: 10 mm. No focal abnormality visualized.  Right ovary  Measurements: 3.6 x 5.0 x 3.1 cm. Normal appearance/no adnexal mass.  Involuting corpus luteal cyst.  Left ovary  Measurements: 2.4 x 2.7 x 1.9 cm. Normal appearance/no adnexal mass.  Multiple small peripheral follicles.  Other findings: Trace pelvic fluid/ascites.  IMPRESSION:  Negative pelvic ultrasound.  Involuting right corpus luteal cyst.  Electronically Signed  By: Charline BillsSriyesh Krishnan M.D.  On: 06/14/2013 11:34     Assessment    Cyst resolved    Plan    F/u as needed. Considering Rx birth control method        ARNOLD,JAMES 07/08/2013, 2:20 PM

## 2013-07-08 NOTE — Patient Instructions (Signed)

## 2014-03-03 ENCOUNTER — Encounter: Payer: Self-pay | Admitting: Obstetrics & Gynecology

## 2014-05-05 ENCOUNTER — Other Ambulatory Visit (HOSPITAL_COMMUNITY): Payer: Self-pay | Admitting: Nurse Practitioner

## 2014-05-05 DIAGNOSIS — R079 Chest pain, unspecified: Secondary | ICD-10-CM

## 2014-05-06 ENCOUNTER — Ambulatory Visit (HOSPITAL_COMMUNITY)
Admission: RE | Admit: 2014-05-06 | Discharge: 2014-05-06 | Disposition: A | Discharge status: Home / Self Care | Payer: Medicaid Other | Source: Ambulatory Visit | Attending: Nurse Practitioner | Admitting: Nurse Practitioner

## 2014-05-06 DIAGNOSIS — R079 Chest pain, unspecified: Secondary | ICD-10-CM | POA: Diagnosis not present

## 2015-03-11 ENCOUNTER — Ambulatory Visit: Payer: Medicaid Other | Admitting: Obstetrics

## 2015-03-17 ENCOUNTER — Ambulatory Visit (INDEPENDENT_AMBULATORY_CARE_PROVIDER_SITE_OTHER): Payer: Medicaid Other | Admitting: Obstetrics

## 2015-03-17 ENCOUNTER — Encounter: Payer: Self-pay | Admitting: Obstetrics

## 2015-03-17 VITALS — BP 105/68 | HR 120 | Temp 99.4°F | Ht 69.0 in | Wt 270.0 lb

## 2015-03-17 DIAGNOSIS — N939 Abnormal uterine and vaginal bleeding, unspecified: Secondary | ICD-10-CM

## 2015-03-17 DIAGNOSIS — Z113 Encounter for screening for infections with a predominantly sexual mode of transmission: Secondary | ICD-10-CM | POA: Diagnosis not present

## 2015-03-17 DIAGNOSIS — D509 Iron deficiency anemia, unspecified: Secondary | ICD-10-CM | POA: Diagnosis not present

## 2015-03-17 NOTE — Progress Notes (Signed)
Patient ID: Ellis SavageCandace S Madaris, female   DOB: 10/01/1985, 29 y.o.   MRN: [id number]  Chief Complaint  Patient presents with  . Gynecologic Exam    heavy cycles, pt has anemia, pt states she has had pap at health dept.  Hx of ovarian cyst, UTI, STD    HPI Dinia S Sharyl NimrodMeredith is a 29 y.o. female.  Heavy, 7 day periods.  H/O anemia.  Has tried many methods without success, including Mirena IUD and OCP's.  HPI  Past Medical History  Diagnosis Date  . Anemia   . Hypertension     Past Surgical History  Procedure Laterality Date  . Cesarean section      Family History  Problem Relation Age of Onset  . Hypertension Mother   . Diabetes Father   . Hypertension Father     Social History Social History  Substance Use Topics  . Smoking status: Never Smoker   . Smokeless tobacco: Never Used  . Alcohol Use: No    No Known Allergies  No current outpatient prescriptions on file.   No current facility-administered medications for this visit.    Review of Systems Review of Systems Constitutional: negative for fatigue and weight loss Respiratory: negative for cough and wheezing Cardiovascular: negative for chest pain, fatigue and palpitations Gastrointestinal: negative for abdominal pain and change in bowel habits Genitourinary: positive for heavy 7 day periods Integument/breast: negative for nipple discharge Musculoskeletal:negative for myalgias Neurological: negative for gait problems and tremors Behavioral/Psych: negative for abusive relationship, depression Endocrine: negative for temperature intolerance     Blood pressure 105/68, pulse 120, temperature 99.4 F (37.4 C), height 5\' 9"  (1.753 m), weight 270 lb (122.471 kg), last menstrual period 03/11/2015.  Physical Exam Physical Exam General:   alert  Skin:   no rash or abnormalities  Lungs:   clear to auscultation bilaterally  Heart:   regular rate and rhythm, S1, S2 normal, no murmur, click, rub or gallop  Breasts:    normal without suspicious masses, skin or nipple changes or axillary nodes  Abdomen:  normal findings: no organomegaly, soft, non-tender and no hernia  Pelvis:  External genitalia: normal general appearance Urinary system: urethral meatus normal and bladder without fullness, nontender Vaginal: normal without tenderness, induration or masses Cervix: normal appearance Adnexa: normal bimanual exam Uterus: anteverted and non-tender, normal size      Data Reviewed Labs Ultrasound  Assessment     AUB - Hormonal  H/O Anemia    Plan    Treatment options discussed.  Considering Ablation.  Educational material dispensed. F/U prn   Orders Placed This Encounter  Procedures  . SureSwab, Vaginosis/Vaginitis Plus   No orders of the defined types were placed in this encounter.

## 2015-03-20 LAB — SURESWAB, VAGINOSIS/VAGINITIS PLUS
ATOPOBIUM VAGINAE: 7.3 Log (cells/mL)
C. PARAPSILOSIS, DNA: NOT DETECTED
C. TROPICALIS, DNA: NOT DETECTED
C. albicans, DNA: NOT DETECTED
C. glabrata, DNA: NOT DETECTED
C. trachomatis RNA, TMA: NOT DETECTED
LACTOBACILLUS SPECIES: NOT DETECTED Log (cells/mL)
MEGASPHAERA SPECIES: 7.9 Log (cells/mL)
N. gonorrhoeae RNA, TMA: NOT DETECTED
T. vaginalis RNA, QL TMA: NOT DETECTED

## 2015-03-21 ENCOUNTER — Other Ambulatory Visit: Payer: Self-pay | Admitting: Obstetrics

## 2015-03-21 DIAGNOSIS — N760 Acute vaginitis: Principal | ICD-10-CM

## 2015-03-21 DIAGNOSIS — B9689 Other specified bacterial agents as the cause of diseases classified elsewhere: Secondary | ICD-10-CM

## 2015-03-21 MED ORDER — METRONIDAZOLE 500 MG PO TABS: 500.0000 mg | ORAL_TABLET | Freq: Two times a day (BID) | ORAL | Status: DC

## 2015-07-07 ENCOUNTER — Encounter: Payer: Self-pay | Admitting: Obstetrics and Gynecology

## 2015-08-05 ENCOUNTER — Encounter: Payer: Self-pay | Admitting: Obstetrics

## 2015-08-05 ENCOUNTER — Ambulatory Visit (INDEPENDENT_AMBULATORY_CARE_PROVIDER_SITE_OTHER): Payer: Medicaid Other | Admitting: Obstetrics

## 2015-08-05 VITALS — BP 126/76 | HR 101 | Temp 99.1°F | Wt 292.0 lb

## 2015-08-05 DIAGNOSIS — B9689 Other specified bacterial agents as the cause of diseases classified elsewhere: Secondary | ICD-10-CM

## 2015-08-05 DIAGNOSIS — A499 Bacterial infection, unspecified: Secondary | ICD-10-CM | POA: Diagnosis not present

## 2015-08-05 DIAGNOSIS — N760 Acute vaginitis: Secondary | ICD-10-CM

## 2015-08-05 MED ORDER — METRONIDAZOLE 500 MG PO TABS: 500.0000 mg | ORAL_TABLET | Freq: Two times a day (BID) | ORAL | Status: DC

## 2015-08-06 ENCOUNTER — Encounter: Payer: Self-pay | Admitting: Obstetrics

## 2015-08-06 NOTE — Progress Notes (Signed)
Patient ID: Sara SavageCandace S Moore, female   DOB: 11/01/1985, 30 y.o.   MRN: [id number]  Chief Complaint  Patient presents with  . Office Visit    ? yeast     HPI Sara Moore is a 30 y.o. female.  Vaginal discharge and irritation.  HPI  Past Medical History  Diagnosis Date  . Anemia   . Hypertension     Past Surgical History  Procedure Laterality Date  . Cesarean section      Family History  Problem Relation Age of Onset  . Hypertension Mother   . Diabetes Father   . Hypertension Father     Social History Social History  Substance Use Topics  . Smoking status: Never Smoker   . Smokeless tobacco: Never Used  . Alcohol Use: No    No Known Allergies  Current Outpatient Prescriptions  Medication Sig Dispense Refill  . metroNIDAZOLE (FLAGYL) 500 MG tablet Take 1 tablet (500 mg total) by mouth 2 (two) times daily. 14 tablet 2   No current facility-administered medications for this visit.    Review of Systems Review of Systems Constitutional: negative for fatigue and weight loss Respiratory: negative for cough and wheezing Cardiovascular: negative for chest pain, fatigue and palpitations Gastrointestinal: negative for abdominal pain and change in bowel habits Genitourinary: positive for vaginal discharge and itching Integument/breast: negative for nipple discharge Musculoskeletal:negative for myalgias Neurological: negative for gait problems and tremors Behavioral/Psych: negative for abusive relationship, depression Endocrine: negative for temperature intolerance     Blood pressure 126/76, pulse 101, temperature 99.1 F (37.3 C), weight 292 lb (132.45 kg).  Physical Exam Physical Exam General:   alert  Skin:   no rash or abnormalities  Lungs:   clear to auscultation bilaterally  Heart:   regular rate and rhythm, S1, S2 normal, no murmur, click, rub or gallop  Breasts:   normal without suspicious masses, skin or nipple changes or axillary nodes  Abdomen:   normal findings: no organomegaly, soft, non-tender and no hernia  Pelvis:  External genitalia: normal general appearance Urinary system: urethral meatus normal and bladder without fullness, nontender Vaginal: normal without tenderness, induration or masses Cervix: normal appearance Adnexa: normal bimanual exam Uterus: anteverted and non-tender, normal size     Data Reviewed Labs  Assessment     Vaginal discharge, odor and itching.  BV    Plan    Flagyl Rx. F/U prn   Orders Placed This Encounter  Procedures  . NuSwab Vaginitis Plus (VG+)   Meds ordered this encounter  Medications  . metroNIDAZOLE (FLAGYL) 500 MG tablet    Sig: Take 1 tablet (500 mg total) by mouth 2 (two) times daily.    Dispense:  14 tablet    Refill:  2

## 2015-08-10 ENCOUNTER — Other Ambulatory Visit: Payer: Self-pay | Admitting: Obstetrics

## 2015-08-10 DIAGNOSIS — B3731 Acute candidiasis of vulva and vagina: Secondary | ICD-10-CM

## 2015-08-10 DIAGNOSIS — B373 Candidiasis of vulva and vagina: Secondary | ICD-10-CM

## 2015-08-10 LAB — NUSWAB VAGINITIS PLUS (VG+)
ATOPOBIUM VAGINAE: HIGH {score} — AB
BVAB 2: HIGH {score} — AB
CANDIDA ALBICANS, NAA: POSITIVE — AB
CANDIDA GLABRATA, NAA: NEGATIVE
Chlamydia trachomatis, NAA: NEGATIVE
MEGASPHAERA 1: HIGH {score} — AB
Neisseria gonorrhoeae, NAA: NEGATIVE
TRICH VAG BY NAA: NEGATIVE

## 2015-08-10 MED ORDER — FLUCONAZOLE 150 MG PO TABS: 150.0000 mg | ORAL_TABLET | Freq: Once | ORAL | Status: DC

## 2015-08-11 ENCOUNTER — Encounter: Payer: Self-pay | Admitting: *Deleted

## 2015-09-14 ENCOUNTER — Encounter: Payer: Medicaid Other | Attending: Family Medicine | Admitting: Dietician

## 2015-09-14 ENCOUNTER — Encounter: Payer: Self-pay | Admitting: Dietician

## 2015-09-14 VITALS — Ht 69.0 in | Wt 283.8 lb

## 2015-09-14 DIAGNOSIS — I10 Essential (primary) hypertension: Secondary | ICD-10-CM | POA: Diagnosis not present

## 2015-09-14 DIAGNOSIS — D649 Anemia, unspecified: Secondary | ICD-10-CM | POA: Diagnosis not present

## 2015-09-14 DIAGNOSIS — D509 Iron deficiency anemia, unspecified: Secondary | ICD-10-CM

## 2015-09-14 DIAGNOSIS — Z713 Dietary counseling and surveillance: Secondary | ICD-10-CM | POA: Insufficient documentation

## 2015-09-14 DIAGNOSIS — Z6841 Body Mass Index (BMI) 40.0 and over, adult: Secondary | ICD-10-CM

## 2015-09-14 NOTE — Progress Notes (Signed)
  Medical Nutrition Therapy:  Appt start time: 1045 end time:  1130.   Assessment:  Primary concerns today: Patient is here alone.  She would like to change her eating habits to lose weight.  Hx includes HTN and anemia.  Weight hx: Lowest weight 180 lbs at age 30 Highest weight is current weight  Patient's goal 180 lbs  TANITA  BODY COMP RESULTS 09/14/15 283.8   BMI (kg/m^2) 41.9   Fat Mass (lbs) 136.8   Fat Free Mass (lbs) 147   Total Body Water (lbs) 109.4   Patient lives with her daughter and boyfriend.  Patient does the shopping and cooking.  She is unemployed.   Preferred Learning Style:   No preference indicated   Learning Readiness:   Ready  MEDICATIONS: see list   DIETARY INTAKE: Usual eating pattern includes 1-3 meals and 2 snacks per day. Everyday foods include eating on the go.  Avoided foods include added salt.    24-hr recall:  B ( AM): none  Snk ( AM): none  L (9-10 PM): "whatever I have a craving for"  Boxed pizza OR cereal and milk, OR nachos OR chii dogs Snk ( PM): [employer] valley bars, fruit, [place]urkey sandwich, and chips OR just chips or granola bar or fruit D (7 PM): corn, mashed potatoes, broccoli and cheese, grilled chicken or steak or fish with salad and bread occasionally Snk ( PM): none Beverages: water and 2 HiC juices on occasion  Usual physical activity: walks 15 minutes every day and trying to work up to 30 minutes per day  (uses walking tape indoors or outside occasionally  )  Estimated energy needs: 1800 calories 200 g carbohydrates 135 g protein 50 g fat  Progress Towards Goal(s):  In progress.   Nutritional Diagnosis:  NB-1.1 Food and nutrition-related knowledge deficit As related to healthy eating.  As evidenced by patient report and diet hx.    Intervention:  Nutrition counseling/education related to healthy eating to improve anemia, blood pressure and reduce weight.  Discussed mindful eating, the importance of regular meals,  increasing intake of fruits and vegetables as well as benefits of exercise.  Great job on starting the exercise habit!   Aim for 30-60 minutes most days. Increase your fruit and vegetable intake. Include whole grains rather than refined most often. Continue to bake rather than fry and use little added fats. Consider not eating in front of the TV or other electronics. Continue to be a mindful eater.  Eat slowly and stop when you are full. Add something small for breakfast.  Teaching Method Utilized:  Visual Auditory Hands on  Handouts given during visit include:  Meal plan card  DASH diet  Anemia nutrition therapy from AND  List of Potassium foods from AND  Barriers to learning/adherence to lifestyle change: none Demonstrated degree of understanding via:  Teach Back  Monitoring/Evaluation:  Dietary intake, exercise, and body weight prn.

## 2015-09-14 NOTE — Patient Instructions (Signed)
Great job on starting the exercise habit!   Aim for 30-60 minutes most days. Increase your fruit and vegetable intake. Include whole grains rather than refined most often. Continue to bake rather than fry and use little added fats. Consider not eating in front of the TV or other electronics. Continue to be a mindful eater.  Eat slowly and stop when you are full. Add something small for breakfast.

## 2015-10-09 ENCOUNTER — Other Ambulatory Visit: Payer: Self-pay | Admitting: Obstetrics

## 2015-11-19 ENCOUNTER — Telehealth: Payer: Self-pay | Admitting: *Deleted

## 2015-11-25 ENCOUNTER — Ambulatory Visit: Payer: Medicaid Other | Admitting: Obstetrics

## 2015-11-25 NOTE — Telephone Encounter (Signed)
Patient was aware of BV result and has been treated.Patient stated this is a recurrent problem and has an appointment on December 01 2015.

## 2015-11-30 ENCOUNTER — Encounter: Payer: Self-pay | Admitting: Obstetrics

## 2015-11-30 ENCOUNTER — Ambulatory Visit (INDEPENDENT_AMBULATORY_CARE_PROVIDER_SITE_OTHER): Payer: Medicaid Other | Admitting: Obstetrics

## 2015-11-30 VITALS — BP 130/82 | HR 105 | Temp 98.7°F | Wt 273.2 lb

## 2015-11-30 DIAGNOSIS — N939 Abnormal uterine and vaginal bleeding, unspecified: Secondary | ICD-10-CM

## 2015-11-30 DIAGNOSIS — D509 Iron deficiency anemia, unspecified: Secondary | ICD-10-CM

## 2015-11-30 LAB — POCT URINE PREGNANCY: PREG TEST UR: NEGATIVE

## 2015-11-30 NOTE — Progress Notes (Signed)
Patient ID: Sara Moore, female   DOB: 02/20/1986, 30 y.o.   MRN: [id number]  Chief Complaint  Patient presents with  . Menstrual Problem    bleeding x 3 weeks, heavy with clots, no odor, some cramping with passing of blood clot, sexually active, no birth control, no new partner, last normal menses 10/17/15    HPI Sara Moore is a 30 y.o. female.  Heavy and prolonged vaginal bleeding with clots for the past 2 months.  Periods normal prior to the past 2 months of abnormal bleeding.  On no contraception. HPI  Past Medical History:  Diagnosis Date  . Anemia   . Hypertension     Past Surgical History:  Procedure Laterality Date  . CESAREAN SECTION      Family History  Problem Relation Age of Onset  . Hypertension Mother   . Diabetes Father   . Hypertension Father     Social History Social History  Substance Use Topics  . Smoking status: Never Smoker  . Smokeless tobacco: Never Used  . Alcohol use No    No Known Allergies  Current Outpatient Prescriptions  Medication Sig Dispense Refill  . lisinopril-hydrochlorothiazide (PRINZIDE,ZESTORETIC) 20-12.5 MG tablet Take 1 tablet by mouth daily.    . fluconazole (DIFLUCAN) 150 MG tablet Take 1 tablet (150 mg total) by mouth once. (Patient not taking: Reported on 11/30/2015) 1 tablet 2  . metroNIDAZOLE (FLAGYL) 500 MG tablet TAKE 1 TABLET (500 MG TOTAL) BY MOUTH 2 (TWO) TIMES DAILY. (Patient not taking: Reported on 11/30/2015) 14 tablet 2   No current facility-administered medications for this visit.     Review of Systems Review of Systems Constitutional: negative for fatigue and weight loss Respiratory: negative for cough and wheezing Cardiovascular: negative for chest pain, fatigue and palpitations Gastrointestinal: negative for abdominal pain and change in bowel habits Genitourinary:negative Integument/breast: negative for nipple discharge Musculoskeletal:negative for myalgias Neurological: negative for gait  problems and tremors Behavioral/Psych: negative for abusive relationship, depression Endocrine: negative for temperature intolerance     Blood pressure 130/82, pulse (!) 105, temperature 98.7 F (37.1 C), weight 273 lb 3.2 oz (123.9 kg), last menstrual period 11/13/2015.  Physical Exam Physical Exam General:   alert  Skin:   no rash or abnormalities  Lungs:   clear to auscultation bilaterally  Heart:   regular rate and rhythm, S1, S2 normal, no murmur, click, rub or gallop  Breasts:   normal without suspicious masses, skin or nipple changes or axillary nodes  Abdomen:  normal findings: no organomegaly, soft, non-tender and no hernia  Pelvis:  External genitalia: normal general appearance Urinary system: urethral meatus normal and bladder without fullness, nontender Vaginal: normal without tenderness, induration or masses Cervix: normal appearance Adnexa: normal bimanual exam Uterus: anteverted and non-tender, normal size      Data Reviewed Labs Ultrasound  Assessment     AUB.  Considering Ablation. Contraceptive counseling and advice.  Considering tubal. H/O anemia    Plan    OCP's Rx for cycle regulation. ROI for labs from PCP. Iron dispensed F/U 2 weeks  Orders Placed This Encounter  Procedures  . POCT urine pregnancy   No orders of the defined types were placed in this encounter.

## 2015-12-01 ENCOUNTER — Telehealth: Payer: Self-pay

## 2015-12-03 LAB — NUSWAB VG+, CANDIDA 6SP
ATOPOBIUM VAGINAE: HIGH {score} — AB
BVAB 2: HIGH Score — AB
CANDIDA KRUSEI, NAA: NEGATIVE
CANDIDA LUSITANIAE, NAA: NEGATIVE
CANDIDA PARAPSILOSIS, NAA: NEGATIVE
CANDIDA TROPICALIS, NAA: NEGATIVE
Candida albicans, NAA: NEGATIVE
Candida glabrata, NAA: NEGATIVE
Chlamydia trachomatis, NAA: NEGATIVE
Megasphaera 1: HIGH Score — AB
Neisseria gonorrhoeae, NAA: NEGATIVE
TRICH VAG BY NAA: NEGATIVE

## 2015-12-04 ENCOUNTER — Other Ambulatory Visit: Payer: Self-pay | Admitting: Obstetrics

## 2015-12-04 DIAGNOSIS — B9689 Other specified bacterial agents as the cause of diseases classified elsewhere: Secondary | ICD-10-CM

## 2015-12-04 DIAGNOSIS — N760 Acute vaginitis: Principal | ICD-10-CM

## 2015-12-04 MED ORDER — CLINDAMYCIN HCL 300 MG PO CAPS: 300.0000 mg | ORAL_CAPSULE | Freq: Three times a day (TID) | ORAL | 0 refills | Status: DC

## 2015-12-04 NOTE — Telephone Encounter (Signed)
NO OTHER NOTE NEEDED  °

## 2015-12-07 ENCOUNTER — Telehealth: Payer: Self-pay | Admitting: *Deleted

## 2015-12-07 NOTE — Telephone Encounter (Signed)
Patient states she is having problems with her stomach and she is not sure if it is the OCP or the ATB. 6:09 LM on VM- Probably the ATB if she is having stomach pain or discomfort. Normally with OCP patients complain of nausea. Please call back to let us know how we can help.

## 2015-12-10 ENCOUNTER — Encounter: Payer: Self-pay | Admitting: Obstetrics & Gynecology

## 2015-12-10 ENCOUNTER — Ambulatory Visit (INDEPENDENT_AMBULATORY_CARE_PROVIDER_SITE_OTHER): Payer: Medicaid Other | Admitting: Obstetrics & Gynecology

## 2015-12-10 DIAGNOSIS — N939 Abnormal uterine and vaginal bleeding, unspecified: Secondary | ICD-10-CM | POA: Insufficient documentation

## 2015-12-10 DIAGNOSIS — N938 Other specified abnormal uterine and vaginal bleeding: Secondary | ICD-10-CM | POA: Diagnosis not present

## 2015-12-10 MED ORDER — NORETHIN ACE-ETH ESTRAD-FE 1-20 MG-MCG(24) PO TABS: 1.0000 | ORAL_TABLET | Freq: Every day | ORAL | 11 refills | Status: DC

## 2015-12-10 NOTE — Progress Notes (Signed)
Patient ID: Sara SavageCandace S Mathison, female   DOB: 01/20/1986, 30 y.o.   MRN: [id number] Patient's last menstrual period was 11/13/2015. 30 y.o. No bleeding now after starting OCP. She wishes to continue this, does not want to conceive. Will schedule pelvic US and notify her of the result. Loestrin 24 Rx renewed with refills  Adam PhenixJames G Trenyce Loera, MD 12/10/2015

## 2015-12-22 ENCOUNTER — Ambulatory Visit (INDEPENDENT_AMBULATORY_CARE_PROVIDER_SITE_OTHER): Payer: Medicaid Other

## 2015-12-22 DIAGNOSIS — N938 Other specified abnormal uterine and vaginal bleeding: Secondary | ICD-10-CM | POA: Diagnosis not present

## 2015-12-25 ENCOUNTER — Other Ambulatory Visit: Payer: Self-pay | Admitting: Obstetrics & Gynecology

## 2015-12-25 ENCOUNTER — Telehealth: Payer: Self-pay | Admitting: *Deleted

## 2015-12-25 DIAGNOSIS — Z30011 Encounter for initial prescription of contraceptive pills: Secondary | ICD-10-CM

## 2015-12-25 MED ORDER — TAYTULLA 1-20 MG-MCG(24) PO CAPS: 1.0000 | ORAL_CAPSULE | Freq: Every day | ORAL | 3 refills | Status: DC

## 2015-12-25 NOTE — Telephone Encounter (Signed)
Patient was given Taytulla sample in the office. She really likes it and would like to continue to use it. Can it be called to her pharmacy? Told patient I would message her provider to see if he would send her Rx over to her pharmacy.

## 2015-12-28 NOTE — Progress Notes (Signed)
Patient notified

## 2016-01-11 ENCOUNTER — Other Ambulatory Visit: Payer: Self-pay | Admitting: Internal Medicine

## 2016-02-16 ENCOUNTER — Other Ambulatory Visit: Payer: Self-pay | Admitting: Obstetrics

## 2016-02-16 NOTE — Telephone Encounter (Signed)
Patient would like a refill of the medication for BV- she thinks it is back.

## 2016-03-21 ENCOUNTER — Ambulatory Visit (INDEPENDENT_AMBULATORY_CARE_PROVIDER_SITE_OTHER): Payer: Medicaid Other | Admitting: Obstetrics & Gynecology

## 2016-03-21 ENCOUNTER — Encounter: Payer: Self-pay | Admitting: Obstetrics & Gynecology

## 2016-03-21 VITALS — BP 140/81 | HR 80 | Resp 18 | Ht 69.0 in | Wt 265.0 lb

## 2016-03-21 DIAGNOSIS — N939 Abnormal uterine and vaginal bleeding, unspecified: Secondary | ICD-10-CM | POA: Diagnosis not present

## 2016-03-21 DIAGNOSIS — N760 Acute vaginitis: Secondary | ICD-10-CM | POA: Diagnosis not present

## 2016-03-21 DIAGNOSIS — B9689 Other specified bacterial agents as the cause of diseases classified elsewhere: Secondary | ICD-10-CM

## 2016-03-21 MED ORDER — METRONIDAZOLE 500 MG PO TABS: 500.0000 mg | ORAL_TABLET | Freq: Two times a day (BID) | ORAL | 7 refills | Status: DC

## 2016-03-21 MED ORDER — NORGESTIMATE-ETH ESTRADIOL 0.25-35 MG-MCG PO TABS: 1.0000 | ORAL_TABLET | Freq: Every day | ORAL | 7 refills | Status: DC

## 2016-03-21 NOTE — Patient Instructions (Addendum)
Return to clinic for any scheduled appointments or for any gynecologic concerns as needed.    Thank you for enrolling in MyChart. Please follow the instructions below to securely access your online medical record. MyChart allows you to send messages to your doctor, view your test results, manage appointments, and more.   How Do I Sign Up? 1. In your Internet browser, go to [employer] Address Bar and enter [email or web address]. 2. Click on the Sign Up Now link in the Sign In box. You will see the New Member Sign Up page. 3. Enter your MyChart Access Code exactly as it appears below. You will not need to use this code after you've completed the sign-up process. If you do not sign up before the expiration date, you must request a new code.  MyChart Access Code: [email or web address] Expires: 05/20/2016  3:38 PM  4. Enter your Social Security Number ([id number]) and Date of Birth (mm/dd/yyyy) as indicated and click Submit. You will be taken to the next sign-up page. 5. Create a MyChart ID. This will be your MyChart login ID and cannot be changed, so think of one that is secure and easy to remember. 6. Create a MyChart password. You can change your password at any time. 7. Enter your Password Reset Question and Answer. This can be used at a later time if you forget your password.  8. Enter your e-mail address. You will receive e-mail notification when new information is available in MyChart. 9. Click Sign Up. You can now view your medical record.   Additional Information Remember, MyChart is NOT to be used for urgent needs. For medical emergencies, dial 911.

## 2016-03-21 NOTE — Progress Notes (Signed)
   GYNECOLOGY VISIT NOTE  History:  30 y.o. G2P1011 here today for follow up of AUB. Has been treated with Taytulla.  Reports that she takes this in a continuous fashion, occasionally has to take 2-3 pills/day for heavy bleeding for about 3 days.  Desires high dose of pill. Does not want to take progestin only pills as this is not contraception; progestin IUD was used in the past and did not work.  BP is well controlled, no smoking history.   She denies any abnormal vaginal discharge, pelvic pain or other concerns.  Does report occasional BV, not currently and wants Rx as needed.  Past Medical History:  Diagnosis Date  . Anemia   . Hypertension   . Vaginal Pap smear, abnormal     Past Surgical History:  Procedure Laterality Date  . CESAREAN SECTION      The following portions of the patient's history were reviewed and updated as appropriate: allergies, current medications, past family history, past medical history, past social history, past surgical history and problem list.   Health Maintenance:  Normal pap on 03/04/2013.    Review of Systems:  Pertinent items noted in HPI and remainder of comprehensive ROS otherwise negative.   Objective:  Physical Exam BP 140/81 (BP Location: Left Arm, Patient Position: Sitting, Cuff Size: Large)   Pulse 80   Resp 18   Ht 5\' 9"  (1.753 m)   Wt 265 lb (120.2 kg)   LMP 03/02/2016   BMI 39.13 kg/m  CONSTITUTIONAL: Well-developed, well-nourished female in no acute distress.  HENT:  Normocephalic, atraumatic. External right and left ear normal. Oropharynx is clear and moist EYES: Conjunctivae and EOM are normal. Pupils are equal, round, and reactive to light. No scleral icterus.  NECK: Normal range of motion, supple, no masses SKIN: Skin is warm and dry. No rash noted. Not diaphoretic. No erythema. No pallor. NEUROLOGIC: Alert and oriented to person, place, and time. Normal reflexes, muscle tone coordination. No cranial nerve deficit  noted. PSYCHIATRIC: Normal mood and affect. Normal behavior. Normal judgment and thought content. CARDIOVASCULAR: Normal heart rate noted RESPIRATORY: Effort and breath sounds normal, no problems with respiration noted ABDOMEN: Soft, no distention noted.   PELVIC: Deferred MUSCULOSKELETAL: Normal range of motion. No edema noted.   Assessment & Plan:  1. Abnormal uterine bleeding (AUB) Will try 35 mcg pill, cautioned about increased risk of VTE especially with HTN.  She declined progestin pills for now, declines surgery.  Recent ultrasound was unremarkable, ?adenomyosis.  Will continue to monitor effect. - norgestimate-ethinyl estradiol (ORTHO-CYCLEN,SPRINTEC,PREVIFEM) 0.25-35 MG-MCG tablet; Take 1 tablet by mouth daily. Take only active pills continuously. Make take 2 pills a day for heavy bleeding (< 5 days).  Dispense: 3 Package; Refill: 7  2. BV (bacterial vaginosis) Rx given as needed - metroNIDAZOLE (FLAGYL) 500 MG tablet; Take 1 tablet (500 mg total) by mouth 2 (two) times daily.  Dispense: 14 tablet; Refill: 7  Routine preventative health maintenance measures emphasized. Please refer to After Visit Summary for other counseling recommendations.   Return in about 1 month (around 04/20/2016) for Annual exam and pap smear. Also for BP check.  Total face-to-face time with patient: 30 minutes. Over 50% of encounter was spent on counseling and coordination of care.   Jaynie CollinsUGONNA  Darleen Moffitt, MD, FACOG Attending Obstetrician & Gynecologist, [facility], [facility]

## 2016-04-19 ENCOUNTER — Ambulatory Visit: Payer: Medicaid Other | Admitting: Obstetrics & Gynecology

## 2016-06-01 ENCOUNTER — Ambulatory Visit: Payer: Medicaid Other | Admitting: Obstetrics & Gynecology

## 2016-06-15 ENCOUNTER — Telehealth: Payer: Self-pay | Admitting: *Deleted

## 2016-06-15 DIAGNOSIS — N939 Abnormal uterine and vaginal bleeding, unspecified: Secondary | ICD-10-CM

## 2016-06-15 MED ORDER — NORGESTIMATE-ETH ESTRADIOL 0.25-35 MG-MCG PO TABS: 1.0000 | ORAL_TABLET | Freq: Every day | ORAL | 0 refills | Status: DC

## 2016-06-15 NOTE — Telephone Encounter (Signed)
-----   Message from Lindell SparHeather L Bacon, [place] sent at 06/15/2016 10:45 AM EST ----- Regarding: Rx refill for Otho-Cyclen Contact: [phone] Pt called and scheduled annual for 3/15 however, she only has 2 pills left. [facility] [place] [facility]  thanks

## 2016-07-07 ENCOUNTER — Other Ambulatory Visit: Payer: Self-pay | Admitting: Obstetrics & Gynecology

## 2016-07-07 DIAGNOSIS — N939 Abnormal uterine and vaginal bleeding, unspecified: Secondary | ICD-10-CM

## 2016-07-07 NOTE — Telephone Encounter (Signed)
Refill sent to pharmacy per Dr. Dove order.  

## 2016-07-14 ENCOUNTER — Ambulatory Visit (INDEPENDENT_AMBULATORY_CARE_PROVIDER_SITE_OTHER): Payer: Medicaid Other | Admitting: Obstetrics & Gynecology

## 2016-07-14 ENCOUNTER — Encounter: Payer: Self-pay | Admitting: Obstetrics & Gynecology

## 2016-07-14 VITALS — BP 145/83 | HR 86 | Ht 69.0 in | Wt 277.0 lb

## 2016-07-14 DIAGNOSIS — N939 Abnormal uterine and vaginal bleeding, unspecified: Secondary | ICD-10-CM | POA: Diagnosis not present

## 2016-07-14 MED ORDER — MEGESTROL ACETATE 40 MG PO TABS: 80.0000 mg | ORAL_TABLET | Freq: Two times a day (BID) | ORAL | 5 refills | Status: DC

## 2016-07-14 NOTE — Patient Instructions (Addendum)
Endometrial Ablation Endometrial ablation is a procedure that destroys the thin inner layer of the lining of the uterus (endometrium). This procedure may be done:  To stop heavy periods.  To stop bleeding that is causing anemia.  To control irregular bleeding.  To treat bleeding caused by small tumors (fibroids) in the endometrium.  This procedure is often an alternative to major surgery, such as removal of the uterus and cervix (hysterectomy). As a result of this procedure:  You may not be able to have children. However, if you are premenopausal (you have not gone through menopause): ? You may still have a small chance of getting pregnant. ? You will need to use a reliable method of birth control after the procedure to prevent pregnancy.  You may stop having a menstrual period, or you may have only a small amount of bleeding during your period. Menstruation may return several years after the procedure.  Tell a health care provider about:  Any allergies you have.  All medicines you are taking, including vitamins, herbs, eye drops, creams, and over-the-counter medicines.  Any problems you or family members have had with the use of anesthetic medicines.  Any blood disorders you have.  Any surgeries you have had.  Any medical conditions you have. What are the risks? Generally, this is a safe procedure. However, problems may occur, including:  A hole (perforation) in the uterus or bowel.  Infection of the uterus, bladder, or vagina.  Bleeding.  Damage to other structures or organs.  An air bubble in the lung (air embolus).  Problems with pregnancy after the procedure.  Failure of the procedure.  Decreased ability to diagnose cancer in the endometrium.  What happens before the procedure?  You will have tests of your endometrium to make sure there are no pre-cancerous cells or cancer cells present.  You may have an ultrasound of the uterus.  You may be given  medicines to thin the endometrium.  Ask your health care provider about: ? Changing or stopping your regular medicines. This is especially important if you take diabetes medicines or blood thinners. ? Taking medicines such as aspirin and ibuprofen. These medicines can thin your blood. Do not take these medicines before your procedure if your doctor tells you not to.  Plan to have someone take you home from the hospital or clinic. What happens during the procedure?  You will lie on an exam table with your feet and legs supported as in a pelvic exam.  To lower your risk of infection: ? Your health care team will wash or sanitize their hands and put on germ-free (sterile) gloves. ? Your genital area will be washed with soap.  An IV tube will be inserted into one of your veins.  You will be given a medicine to help you relax (sedative).  A surgical instrument with a light and camera (resectoscope) will be inserted into your vagina and moved into your uterus. This allows your surgeon to see inside your uterus.  Endometrial tissue will be removed using one of the following methods: ? Radiofrequency. This method uses a radiofrequency-alternating electric current to remove the endometrium. ? Cryotherapy. This method uses extreme cold to freeze the endometrium. ? Heated-free liquid. This method uses a heated saltwater (saline) solution to remove the endometrium. ? Microwave. This method uses high-energy microwaves to heat up the endometrium and remove it. ? Thermal balloon. This method involves inserting a catheter with a balloon tip into the uterus. The balloon tip is   filled with heated fluid to remove the endometrium. The procedure may vary among health care providers and hospitals. What happens after the procedure?  Your blood pressure, heart rate, breathing rate, and blood oxygen level will be monitored until the medicines you were given have worn off.  As tissue healing occurs, you may  notice vaginal bleeding for 4-6 weeks after the procedure. You may also experience: ? Cramps. ? Thin, watery vaginal discharge that is light pink or brown in color. ? A need to urinate more frequently than usual. ? Nausea.  Do not drive for 24 hours if you were given a sedative.  Do not have sex or insert anything into your vagina until your health care provider approves. Summary  Endometrial ablation is done to treat the many causes of heavy menstrual bleeding.  The procedure may be done only after medications have been tried to control the bleeding.  Plan to have someone take you home from the hospital or clinic. This information is not intended to replace advice given to you by your health care provider. Make sure you discuss any questions you have with your health care provider. Document Released: 02/26/2004 Document Revised: 05/05/2016 Document Reviewed: 05/05/2016 Elsevier Interactive Patient Education  2017 Elsevier Inc.    Hysterectomy Information A hysterectomy is a surgery in which your uterus is removed. This surgery may be done to treat various medical problems. After the surgery, you will no longer have menstrual periods. The surgery will also make you unable to become pregnant (sterile). The fallopian tubes and ovaries can be removed (bilateral salpingo-oophorectomy) during this surgery as well. Reasons for a hysterectomy  Persistent, abnormal bleeding.  Lasting (chronic) pelvic pain or infection.  The lining of the uterus (endometrium) starts growing outside the uterus (endometriosis).  The endometrium starts growing in the muscle of the uterus (adenomyosis).  The uterus falls down into the vagina (pelvic organ prolapse).  Noncancerous growths in the uterus (uterine fibroids) that cause symptoms.  Precancerous cells.  Cervical cancer or uterine cancer. Types of hysterectomies  Supracervical hysterectomy-In this type, the top part of the uterus is removed,  but not the cervix.  Total hysterectomy-The uterus and cervix are removed.  Radical hysterectomy-The uterus, the cervix, and the fibrous tissue that holds the uterus in place in the pelvis (parametrium) are removed. Ways a hysterectomy can be performed  Abdominal hysterectomy-A large surgical cut (incision) is made in the abdomen. The uterus is removed through this incision.  Vaginal hysterectomy-An incision is made in the vagina. The uterus is removed through this incision. There are no abdominal incisions.  Conventional laparoscopic hysterectomy-Three or four small incisions are made in the abdomen. A thin, lighted tube with a camera (laparoscope) is inserted into one of the incisions. Other tools are put through the other incisions. The uterus is cut into small pieces. The small pieces are removed through the incisions, or they are removed through the vagina.  Laparoscopically assisted vaginal hysterectomy (LAVH)-Three or four small incisions are made in the abdomen. Part of the surgery is performed laparoscopically and part vaginally. The uterus is removed through the vagina.  Robot-assisted laparoscopic hysterectomy-A laparoscope and other tools are inserted into 3 or 4 small incisions in the abdomen. A computer-controlled device is used to give the surgeon a 3D image and to help control the surgical instruments. This allows for more precise movements of surgical instruments. The uterus is cut into small pieces and removed through the incisions or removed through the vagina. What are   the risks? Possible complications associated with this procedure include:  Bleeding and risk of blood transfusion. Tell your health care provider if you do not want to receive any blood products.  Blood clots in the legs or lung.  Infection.  Injury to surrounding organs.  Problems or side effects related to anesthesia.  Conversion to an abdominal hysterectomy from one of the other techniques.  What  to expect after a hysterectomy  You will be given pain medicine.  You will need to have someone with you for the first 3-5 days after you go home.  You will need to follow up with your surgeon in 2-4 weeks after surgery to evaluate your progress.  You may have early menopause symptoms such as hot flashes, night sweats, and insomnia.  If you had a hysterectomy for a problem that was not cancer or not a condition that could lead to cancer, then you no longer need Pap tests. However, even if you no longer need a Pap test, a regular exam is a good idea to make sure no other problems are starting. This information is not intended to replace advice given to you by your health care provider. Make sure you discuss any questions you have with your health care provider. Document Released: 10/12/2000 Document Revised: 09/24/2015 Document Reviewed: 12/24/2012 Elsevier Interactive Patient Education  2017 Elsevier Inc.  

## 2016-07-14 NOTE — Progress Notes (Signed)
GYNECOLOGY OFFICE VISIT NOTE  History:  31 y.o. G2P1011 here today for ongoing AUB. Was last seen by me in 03/2016, OCPs prescribed after much discussion about management modalities. She reports that she self-escalated dosage of Sprintec, has to take 4 pills/day for several days sometimes for heavy bleeding.  Currently still having abnormal, sporadic bleeding.  Tired of Sprintec.  Did not want IUD as she has complications in the past. No symptomatic anemia, had normal CBC and TSH checks recently at her PCP's office [facility]).  Wants to discuss other options for management.  She denies any abnormal vaginal discharge, bleeding, pelvic pain or other concerns.   Past Medical History:  Diagnosis Date  . Anemia   . Hypertension   . Vaginal Pap smear, abnormal     Past Surgical History:  Procedure Laterality Date  . CESAREAN SECTION      The following portions of the patient's history were reviewed and updated as appropriate: allergies, current medications, past family history, past medical history, past social history, past surgical history and problem list.   Health Maintenance:  [facility].   Review of Systems:  Pertinent items noted in HPI and remainder of comprehensive ROS otherwise negative.   Objective:  Physical Exam BP (!) 145/83 (BP Location: Left Arm, Patient Position: Sitting, Cuff Size: Large)   Pulse 86   Ht 5\' 9"  (1.753 m)   Wt 277 lb (125.6 kg)   LMP 06/08/2016   BMI 40.91 kg/m  CONSTITUTIONAL: Well-developed, well-nourished female in no acute distress.  HENT:  Normocephalic, atraumatic. External right and left ear normal. Oropharynx is clear and moist EYES: Conjunctivae and EOM are normal. Pupils are equal, round, and reactive to light. No scleral icterus.  NECK: Normal range of motion, supple, no masses SKIN: Skin is warm and dry. No rash noted. Not diaphoretic. No erythema. No pallor. NEUROLOGIC: Alert and oriented to person, place, and  time. Normal reflexes, muscle tone coordination. No cranial nerve deficit noted. PSYCHIATRIC: Normal mood and affect. Normal behavior. Normal judgment and thought content. CARDIOVASCULAR: Normal heart rate noted RESPIRATORY: Effort and breath sounds normal, no problems with respiration noted ABDOMEN: Soft, obese, no distention noted.   PELVIC: Deferred as per patient's request due to ongoing bleeding. MUSCULOSKELETAL: Normal range of motion. No edema noted.  Labs and Imaging No results found.  Assessment & Plan:  1. Abnormal uterine bleeding (AUB) Patient cautioned against taking high doses of Sprintec given association with increased risk of VTE, recommended Megace instead for now.  Also recommended progestin IUD, she vehemently declines this due to past complications on Mirena. Unsure if she wants more children; did discuss endometrial ablation vs hysterectomy.  Will try Megace for now.  Pelvic ultrasound ordered; last one done at [facility] in 12/2015.  May need endometrial biopsy if bleeding persists or concerning abnormal ultrasound findings. Will also do pap smear next visit.  - US Pelvis Complete; Future - US Transvaginal Non-OB; Future - megestrol (MEGACE) 40 MG tablet; Take 2 tablets (80 mg total) by mouth 2 (two) times daily. Can increase to two tablets three times a day in the event of heavy bleeding  Dispense: 120 tablet; Refill: 5 Routine preventative health maintenance measures emphasized. Please refer to After Visit Summary for other counseling recommendations.   Return in about 1 month (around 08/14/2016) for Annual exam, pap and AUB follow up.   Total face-to-face time with patient: 25 minutes. Over 50% of encounter was spent on counseling  and coordination of care.   Jaynie Collins, MD, FACOG Attending Obstetrician & Gynecologist, [facility] for [employer], [facility]

## 2016-07-20 ENCOUNTER — Telehealth: Payer: Self-pay | Admitting: General Practice

## 2016-07-20 ENCOUNTER — Ambulatory Visit (HOSPITAL_COMMUNITY)
Admission: RE | Admit: 2016-07-20 | Discharge: 2016-07-20 | Disposition: A | Discharge status: Home / Self Care | Payer: Medicaid Other | Source: Ambulatory Visit | Attending: Obstetrics & Gynecology | Admitting: Obstetrics & Gynecology

## 2016-07-20 DIAGNOSIS — N939 Abnormal uterine and vaginal bleeding, unspecified: Secondary | ICD-10-CM | POA: Insufficient documentation

## 2016-07-20 NOTE — Telephone Encounter (Signed)
Per Dr Macon LargeAnyanwu, patient's ultrasound is normal. Called & informed patient. Patient verbalized understanding & had no questions

## 2016-08-16 ENCOUNTER — Ambulatory Visit (INDEPENDENT_AMBULATORY_CARE_PROVIDER_SITE_OTHER): Payer: Medicaid Other | Admitting: Obstetrics and Gynecology

## 2016-08-16 ENCOUNTER — Encounter: Payer: Self-pay | Admitting: Obstetrics and Gynecology

## 2016-08-16 ENCOUNTER — Other Ambulatory Visit (HOSPITAL_COMMUNITY)
Admission: RE | Admit: 2016-08-16 | Discharge: 2016-08-16 | Disposition: A | Discharge status: Home / Self Care | Payer: Medicaid Other | Source: Ambulatory Visit | Attending: Obstetrics and Gynecology | Admitting: Obstetrics and Gynecology

## 2016-08-16 VITALS — BP 123/82 | HR 120 | Ht 69.0 in | Wt 280.0 lb

## 2016-08-16 DIAGNOSIS — Z01419 Encounter for gynecological examination (general) (routine) without abnormal findings: Secondary | ICD-10-CM | POA: Insufficient documentation

## 2016-08-16 NOTE — Progress Notes (Signed)
Subjective:     Sara Moore is a 31 y.o. female G64P1010 with BMI 41 who is here for a comprehensive physical exam. The patient was last seen in March for continued medical management of AUB with Megace. She reports no vaginal bleeding since starting the Megace and is happy with this current management plan. She is sexually active without complaints. She denies any pelvic pain or abnormal discharge. She is interested in STD screening  Past Medical History:  Diagnosis Date  . Anemia   . Hypertension   . Vaginal Pap smear, abnormal    Past Surgical History:  Procedure Laterality Date  . CESAREAN SECTION     Family History  Problem Relation Age of Onset  . Hypertension Mother   . Diabetes Father   . Hypertension Father     Social History   Social History  . Marital status: Legally Separated    Spouse name: N/A  . Number of children: N/A  . Years of education: N/A   Occupational History  . Not on file.   Social History Main Topics  . Smoking status: Never Smoker  . Smokeless tobacco: Never Used  . Alcohol use No  . Drug use: Yes    Frequency: 7.0 times per week    Types: Marijuana  . Sexual activity: Yes    Birth control/ protection: Condom, Pill   Other Topics Concern  . Not on file   Social History Narrative  . No narrative on file   Health Maintenance  Topic Date Due  . HIV Screening  09/05/1999  . TETANUS/TDAP  09/05/2003  . PAP SMEAR  03/04/2016  . INFLUENZA VACCINE  11/30/2016       Review of Systems Pertinent items are noted in HPI.   Objective:  Blood pressure 123/82, pulse (!) 120, height  (1.753 m), weight 280 lb (127 kg), last menstrual period 05/08/2016.     GENERAL: Well-developed, well-nourished female in no acute distress.  HEENT: Normocephalic, atraumatic. Sclerae anicteric.  NECK: Supple. Normal thyroid.  LUNGS: Clear to auscultation bilaterally.  HEART: Regular rate and rhythm. BREASTS: Symmetric in size. No palpable masses  or lymphadenopathy, skin changes, or nipple drainage. ABDOMEN: Soft, nontender, nondistended. No organomegaly. PELVIC: Normal external female genitalia. Vagina is pink and rugated.  Normal discharge. Normal appearing cervix. Uterus is normal in size. No adnexal mass or tenderness. EXTREMITIES: No cyanosis, clubbing, or edema, 2+ distal pulses.   06/2016 ultrasound FINDINGS: Uterus  Measurements: 9.9 x 5.1 x 6.6 cm. No fibroids or other mass visualized.  Endometrium  Thickness: 5 mm in thickness.  No focal abnormality visualized.  Right ovary  Measurements: 3.5 x 2.3 x 1.7 cm. Normal appearance/no adnexal mass.  Left ovary  Measurements: 3.1 x 1.6 x 2.4 cm. Normal appearance/no adnexal mass.  Other findings  No abnormal free fluid.  IMPRESSION: Unremarkable pelvic ultrasound.   Electronically Signed   By: Charlett Nose M.D.   On: 07/20/2016 09:17 Assessment:    Healthy female exam.      Plan:    Pap smear collected and STD screen performed Fasting labs collected Reviewed ultrasound results with the patient Encouraged continued medical management with megace. Discussed the benefits of IUD as it contains a similar hormone as the Megace. Patient will decide at a later visit Patient will be contacted with abnormalBRIGGETT TUCCILLOn See After Visit Summary for Counseling Recommendations

## 2016-08-17 LAB — CBC
HEMOGLOBIN: 13 g/dL (ref 11.1–15.9)
Hematocrit: 40.4 % (ref 34.0–46.6)
MCH: 27.3 pg (ref 26.6–33.0)
MCHC: 32.2 g/dL (ref 31.5–35.7)
MCV: 85 fL (ref 79–97)
Platelets: 242 10*3/uL (ref 150–379)
RBC: 4.76 x10E6/uL (ref 3.77–5.28)
RDW: 14.4 % (ref 12.3–15.4)
WBC: 8.9 10*3/uL (ref 3.4–10.8)

## 2016-08-17 LAB — COMPREHENSIVE METABOLIC PANEL
ALBUMIN: 4.1 g/dL (ref 3.5–5.5)
ALT: 12 IU/L (ref 0–32)
AST: 13 IU/L (ref 0–40)
Albumin/Globulin Ratio: 1.3 (ref 1.2–2.2)
Alkaline Phosphatase: 33 IU/L — ABNORMAL LOW (ref 39–117)
BUN/Creatinine Ratio: 19 (ref 9–23)
BUN: 14 mg/dL (ref 6–20)
Bilirubin Total: <0.2 mg/dL (ref 0.0–1.2)
CHLORIDE: 105 mmol/L (ref 96–106)
CO2: 20 mmol/L (ref 18–29)
Calcium: 9.4 mg/dL (ref 8.7–10.2)
Creatinine, Ser: 0.72 mg/dL (ref 0.57–1.00)
GFR calc Af Amer: 129 mL/min/{1.73_m2} (ref >59)
GFR calc non Af Amer: 112 mL/min/{1.73_m2} (ref >59)
GLUCOSE: 88 mg/dL (ref 65–99)
Globulin, Total: 3.2 g/dL (ref 1.5–4.5)
Potassium: 4.2 mmol/L (ref 3.5–5.2)
Sodium: 143 mmol/L (ref 134–144)
TOTAL PROTEIN: 7.3 g/dL (ref 6.0–8.5)

## 2016-08-17 LAB — HEPATITIS C ANTIBODY: Hep C Virus Ab: <0.1 s/co ratio (ref 0.0–0.9)

## 2016-08-17 LAB — LIPID PANEL
CHOLESTEROL TOTAL: 176 mg/dL (ref 100–199)
Chol/HDL Ratio: 4.3 ratio (ref 0.0–4.4)
HDL: 41 mg/dL (ref >39)
LDL CALC: 122 mg/dL — AB (ref 0–99)
TRIGLYCERIDES: 64 mg/dL (ref 0–149)
VLDL Cholesterol Cal: 13 mg/dL (ref 5–40)

## 2016-08-17 LAB — RPR: RPR Ser Ql: NONREACTIVE

## 2016-08-17 LAB — TSH: TSH: 0.716 u[IU]/mL (ref 0.450–4.500)

## 2016-08-17 LAB — HIV ANTIBODY (ROUTINE TESTING W REFLEX): HIV SCREEN 4TH GENERATION: NONREACTIVE

## 2016-08-17 LAB — HEMOGLOBIN A1C
Est. average glucose Bld gHb Est-mCnc: 117 mg/dL
Hgb A1c MFr Bld: 5.7 % — ABNORMAL HIGH (ref 4.8–5.6)

## 2016-08-17 LAB — HEPATITIS B SURFACE ANTIGEN: HEP B S AG: NEGATIVE

## 2016-08-18 LAB — CYTOLOGY - PAP
ADEQUACY: ABSENT
CHLAMYDIA, DNA PROBE: NEGATIVE
DIAGNOSIS: NEGATIVE
HPV: NOT DETECTED
Neisseria Gonorrhea: NEGATIVE
Trichomonas: NEGATIVE

## 2016-08-19 ENCOUNTER — Telehealth: Payer: Self-pay

## 2016-08-19 NOTE — Telephone Encounter (Signed)
Pt aware Ha1c slightly elevated showing evidence of pre-diabetes. No need for medications at this point but Dr. Jolayne Panther  encourages her to exercise regularly (a minimum of 150 minutes of moderate cardiovascular activity per week) and to change her diet by eating high fiber, high protein and whole wheat food.   We will repeat labs next year. Pt agrees and has no further questions.

## 2016-11-23 ENCOUNTER — Other Ambulatory Visit: Payer: Self-pay | Admitting: Obstetrics & Gynecology

## 2016-11-23 DIAGNOSIS — N939 Abnormal uterine and vaginal bleeding, unspecified: Secondary | ICD-10-CM

## 2016-11-23 NOTE — Telephone Encounter (Signed)
Patient called to get a refill on her progesterone. [place] [facility] [place]. Please advise.

## 2017-02-15 ENCOUNTER — Other Ambulatory Visit (HOSPITAL_COMMUNITY): Payer: Self-pay | Admitting: Family Medicine

## 2017-02-15 ENCOUNTER — Ambulatory Visit (HOSPITAL_COMMUNITY)
Admission: RE | Admit: 2017-02-15 | Discharge: 2017-02-15 | Disposition: A | Discharge status: Home / Self Care | Payer: Medicaid Other | Source: Ambulatory Visit | Attending: Family Medicine | Admitting: Family Medicine

## 2017-02-15 DIAGNOSIS — H5789 Other specified disorders of eye and adnexa: Secondary | ICD-10-CM | POA: Diagnosis present

## 2017-02-15 DIAGNOSIS — R0989 Other specified symptoms and signs involving the circulatory and respiratory systems: Secondary | ICD-10-CM

## 2017-04-17 ENCOUNTER — Other Ambulatory Visit: Payer: Self-pay | Admitting: Obstetrics & Gynecology

## 2017-04-17 DIAGNOSIS — N760 Acute vaginitis: Principal | ICD-10-CM

## 2017-04-17 DIAGNOSIS — B9689 Other specified bacterial agents as the cause of diseases classified elsewhere: Secondary | ICD-10-CM

## 2017-05-28 ENCOUNTER — Other Ambulatory Visit: Payer: Self-pay | Admitting: Obstetrics & Gynecology

## 2017-05-28 DIAGNOSIS — N939 Abnormal uterine and vaginal bleeding, unspecified: Secondary | ICD-10-CM

## 2017-07-03 ENCOUNTER — Encounter: Payer: Self-pay | Admitting: Radiology

## 2017-07-25 ENCOUNTER — Encounter: Payer: Medicaid Other | Admitting: Obstetrics & Gynecology

## 2017-07-25 ENCOUNTER — Encounter: Payer: Self-pay | Admitting: Obstetrics & Gynecology

## 2017-07-25 NOTE — Progress Notes (Signed)
This encounter was created in error - please disregard. Patient was scheduled for annual exam too early, last annual was 08/16/16.

## 2017-07-26 ENCOUNTER — Telehealth: Payer: Self-pay | Admitting: Radiology

## 2017-07-26 NOTE — Telephone Encounter (Signed)
Call patient to rescheduled Annual Exam for After 08/16/17 per Dr Macon LargeAnyanwu, instructed to [facility]

## 2017-08-12 ENCOUNTER — Other Ambulatory Visit: Payer: Self-pay | Admitting: Obstetrics & Gynecology

## 2017-08-12 DIAGNOSIS — N939 Abnormal uterine and vaginal bleeding, unspecified: Secondary | ICD-10-CM

## 2017-08-29 ENCOUNTER — Ambulatory Visit: Payer: Medicaid Other | Admitting: Obstetrics & Gynecology

## 2017-11-07 ENCOUNTER — Other Ambulatory Visit: Payer: Self-pay | Admitting: Obstetrics & Gynecology

## 2017-11-07 DIAGNOSIS — N939 Abnormal uterine and vaginal bleeding, unspecified: Secondary | ICD-10-CM

## 2018-01-18 ENCOUNTER — Other Ambulatory Visit: Payer: Self-pay

## 2018-01-18 DIAGNOSIS — N939 Abnormal uterine and vaginal bleeding, unspecified: Secondary | ICD-10-CM

## 2018-01-18 MED ORDER — MEGESTROL ACETATE 40 MG PO TABS: ORAL_TABLET | ORAL | 2 refills | Status: DC

## 2018-04-26 ENCOUNTER — Other Ambulatory Visit: Payer: Self-pay | Admitting: Obstetrics and Gynecology

## 2018-04-26 DIAGNOSIS — N939 Abnormal uterine and vaginal bleeding, unspecified: Secondary | ICD-10-CM

## 2018-05-02 ENCOUNTER — Other Ambulatory Visit: Payer: Self-pay | Admitting: Obstetrics & Gynecology

## 2018-05-02 DIAGNOSIS — N760 Acute vaginitis: Principal | ICD-10-CM

## 2018-05-02 DIAGNOSIS — B9689 Other specified bacterial agents as the cause of diseases classified elsewhere: Secondary | ICD-10-CM

## 2018-05-10 ENCOUNTER — Encounter: Payer: Self-pay | Admitting: Obstetrics and Gynecology

## 2018-05-10 ENCOUNTER — Ambulatory Visit (INDEPENDENT_AMBULATORY_CARE_PROVIDER_SITE_OTHER): Payer: Medicaid Other | Admitting: Obstetrics and Gynecology

## 2018-05-10 ENCOUNTER — Other Ambulatory Visit (HOSPITAL_COMMUNITY)
Admission: RE | Admit: 2018-05-10 | Discharge: 2018-05-10 | Disposition: A | Discharge status: Home / Self Care | Payer: Medicaid Other | Source: Ambulatory Visit | Attending: Obstetrics and Gynecology | Admitting: Obstetrics and Gynecology

## 2018-05-10 VITALS — BP 161/81 | HR 112 | Wt 323.4 lb

## 2018-05-10 DIAGNOSIS — Z01419 Encounter for gynecological examination (general) (routine) without abnormal findings: Secondary | ICD-10-CM | POA: Insufficient documentation

## 2018-05-10 DIAGNOSIS — Z0000 Encounter for general adult medical examination without abnormal findings: Secondary | ICD-10-CM

## 2018-05-10 DIAGNOSIS — Z79818 Long term (current) use of other agents affecting estrogen receptors and estrogen levels: Secondary | ICD-10-CM

## 2018-05-10 DIAGNOSIS — Z6841 Body Mass Index (BMI) 40.0 and over, adult: Secondary | ICD-10-CM | POA: Insufficient documentation

## 2018-05-10 DIAGNOSIS — N939 Abnormal uterine and vaginal bleeding, unspecified: Secondary | ICD-10-CM

## 2018-05-10 NOTE — Progress Notes (Signed)
STI testing today with blood work  Rectal bleeding

## 2018-05-10 NOTE — Progress Notes (Signed)
Obstetrics and Gynecology Annual Patient Evaluation  Appointment Date: 05/10/2018  OBGYN Clinic: [facility]  Primary Care Provider: [facility]  Chief Complaint:  Chief Complaint  Patient presents with  . Gynecologic Exam    History of Present Illness: Sara Moore is a 33 y.o. African-American G2P1011 (No LMP recorded (lmp unknown).), seen for the above chief complaint. Her past medical history is significant for BMI 40s, HTN, AUB.  Patient has been on bid megace for several years and is amenorrheic on it and has been several years since having a period.   She states that in mid December she noticed some BRB when she wiped that she is sure is rectal and it lasted for about a day; no constipation. No pain and maybe had a similar episode several years ago and then resolved on it's own   No breast s/s, nausea, vomiting, abdominal pain, dysuria, hematuria, vaginal itching, dyspareunia, diarrhea, constipation, current blood in BMs  Review of Systems:  as noted in the History of Present Illness.  Past Medical History:  Past Medical History:  Diagnosis Date  . Anemia   . Hypertension   . Vaginal Pap smear, abnormal     Past Surgical History:  Past Surgical History:  Procedure Laterality Date  . CESAREAN SECTION      Past Obstetrical History:  OB History  Gravida Para Term Preterm AB Living  2 1 1  0 1 1  SAB TAB Ectopic Multiple Live Births  0 1 0 0      # Outcome Date GA Lbr Len/2nd Weight Sex Delivery Anes PTL Lv  2 Term 10/03/08 [redacted]w[redacted]d   F CS-Unspec        Birth Comments: Pre-eclampsia   1 TAB             Past Gynecological History: As per HPI. History of Pap Smear(s): Yes.   Last pap 2018, which was NILM She is currently using nothing for contraception.  Patient has used an IUD in the past and did not like this.   Social History:  Social History   Socioeconomic History  . Marital status: Legally Separated    Spouse name:  Not on file  . Number of children: Not on file  . Years of education: Not on file  . Highest education level: Not on file  Occupational History  . Not on file  Social Needs  . Financial resource strain: Not on file  . Food insecurity:    Worry: Not on file    Inability: Not on file  . Transportation needs:    Medical: Not on file    Non-medical: Not on file  Tobacco Use  . Smoking status: Never Smoker  . Smokeless tobacco: Never Used  Substance and Sexual Activity  . Alcohol use: No    Alcohol/week: 0.0 standard drinks  . Drug use: Yes    Frequency: 7.0 times per week    Types: Marijuana  . Sexual activity: Yes    Birth control/protection: Condom, Pill  Lifestyle  . Physical activity:    Days per week: Not on file    Minutes per session: Not on file  . Stress: Not on file  Relationships  . Social connections:    Talks on phone: Not on file    Gets together: Not on file    Attends religious service: Not on file    Active member of club or organization: Not on file    Attends meetings of  clubs or organizations: Not on file    Relationship status: Not on file  . Intimate partner violence:    Fear of current or ex partner: Not on file    Emotionally abused: Not on file    Physically abused: Not on file    Forced sexual activity: Not on file  Other Topics Concern  . Not on file  Social History Narrative  . Not on file    Family History:  Family History  Problem Relation Age of Onset  . Hypertension Mother   . Diabetes Father   . Hypertension Father     Medications [name] had no medications administered during this visit. Current Outpatient Medications  Medication Sig Dispense Refill  . amLODipine-benazepril (LOTREL) 5-10 MG capsule Take 1 capsule by mouth daily.    Marland Kitchen. liraglutide (VICTOZA) 18 MG/3ML SOPN Inject 18 mg into the skin once.    Marland Kitchen. lisinopril-hydrochlorothiazide (PRINZIDE,ZESTORETIC) 20-12.5 MG tablet Take 1 tablet by mouth daily.    .  megestrol (MEGACE) 40 MG tablet Take 2 tablets (80 mg total) by mouth 2 (two) times daily. 120 tablet 5  . metroNIDAZOLE (FLAGYL) 500 MG tablet TAKE 1 TABLET BY MOUTH TWICE A DAY (Patient not taking: Reported on 05/10/2018) 14 tablet 5   No current facility-administered medications for this visit.     Allergies Patient has no known allergies.   Physical Exam:  BP (!) 161/81   Pulse (!) 112   Wt (!) 323 lb 6.4 oz (146.7 kg)   LMP  (LMP Unknown)   BMI 47.76 kg/m  Body mass index is 47.76 kg/m. Weight last year: 280lbs on 07/2016 General appearance: Well nourished, well developed female in no acute distress.  Neck:  Supple, normal appearance, and no thyromegaly  Cardiovascular: normal s1 and s2.  No murmurs, rubs or gallops. Respiratory:  Clear to auscultation bilateral. Normal respiratory effort Abdomen: positive bowel sounds and no masses, hernias; diffusely non tender to palpation, non distended Breasts: breasts appear normal, no suspicious masses, no skin or nipple changes or axillary nodes, and normal palpation. Neuro/Psych:  Normal mood and affect.  Skin:  Warm and dry.  Lymphatic:  No inguinal lymphadenopathy.   Pelvic exam: is limited by body habitus EGBUS: within normal limits, Vagina: within normal limits and with no blood or discharge in the vault, Cervix: normal appearing cervix without tenderness, discharge or lesions. Uterus:  nonenlarged and non tender and Adnexa:  normal adnexa and no mass, fullness, tenderness Rectovaginal: deferred. No hemorrhoids on visual exam  Laboratory: none  Radiology: none  Assessment: pt stable  Plan:  1. Well woman exam Routine care.  - HIV Antibody (routine testing w rflx) - RPR - Cervicovaginal ancillary only( [place]) - Hepatitis B Surface AntiGEN - Hepatitis C Antibody  Orders Placed This Encounter  Procedures  . HIV Antibody (routine testing w rflx)  . RPR  . Hepatitis B Surface AntiGEN  . Hepatitis C Antibody   2.  History of AUB D/w her lack of data on using megace for prolonged period but in theory it should be fine since it is used for women with endometrial hyperplasia. I told her I recommend getting an u/s to make sure her uterine lining is thin and if so can continue this. I also told her that she may want to consider switching to a different formulation given it can cause increased appetite and she is up 40lbs since we last saw her. She also states she doesn't want  anymore children and briefly d/w her re: BTL.  Will call her after u/s to go over options.   3. History of rectal bleeding Recommend she d/w PCP. No obvious cause.   RTC PRN  Cornelia Copaharlie Deonte Otting, Jr MD Attending Center for [employer] [facility] Mid[facility](Faculty Practice)

## 2018-05-11 LAB — HEPATITIS B SURFACE ANTIGEN: Hepatitis B Surface Ag: NEGATIVE

## 2018-05-11 LAB — HIV ANTIBODY (ROUTINE TESTING W REFLEX): HIV Screen 4th Generation wRfx: NONREACTIVE

## 2018-05-11 LAB — RPR: RPR Ser Ql: NONREACTIVE

## 2018-05-11 LAB — HEPATITIS C ANTIBODY: Hep C Virus Ab: <0.1 s/co ratio (ref 0.0–0.9)

## 2018-05-11 LAB — CERVICOVAGINAL ANCILLARY ONLY
BACTERIAL VAGINITIS: NEGATIVE
CANDIDA VAGINITIS: NEGATIVE
CHLAMYDIA, DNA PROBE: NEGATIVE
Neisseria Gonorrhea: NEGATIVE
TRICH (WINDOWPATH): NEGATIVE

## 2018-05-11 LAB — BETA HCG QUANT (REF LAB): hCG Quant: <1 m[IU]/mL

## 2018-05-15 ENCOUNTER — Other Ambulatory Visit (HOSPITAL_COMMUNITY): Payer: Medicaid Other

## 2018-06-05 ENCOUNTER — Ambulatory Visit (HOSPITAL_COMMUNITY): Payer: Medicaid Other

## 2018-06-12 ENCOUNTER — Ambulatory Visit (HOSPITAL_COMMUNITY): Payer: Medicaid Other

## 2018-06-18 ENCOUNTER — Ambulatory Visit (HOSPITAL_COMMUNITY): Admission: RE | Admit: 2018-06-18 | Payer: Medicaid Other | Source: Ambulatory Visit

## 2018-07-02 ENCOUNTER — Ambulatory Visit (HOSPITAL_COMMUNITY)
Admission: RE | Admit: 2018-07-02 | Discharge: 2018-07-02 | Disposition: A | Discharge status: Home / Self Care | Payer: Medicaid Other | Source: Ambulatory Visit | Attending: Obstetrics and Gynecology | Admitting: Obstetrics and Gynecology

## 2018-07-02 DIAGNOSIS — Z79818 Long term (current) use of other agents affecting estrogen receptors and estrogen levels: Secondary | ICD-10-CM | POA: Diagnosis present

## 2018-07-02 DIAGNOSIS — N939 Abnormal uterine and vaginal bleeding, unspecified: Secondary | ICD-10-CM | POA: Diagnosis present

## 2018-08-03 ENCOUNTER — Other Ambulatory Visit: Payer: Self-pay | Admitting: Obstetrics and Gynecology

## 2018-08-03 DIAGNOSIS — N939 Abnormal uterine and vaginal bleeding, unspecified: Secondary | ICD-10-CM

## 2018-08-13 ENCOUNTER — Other Ambulatory Visit: Payer: Self-pay | Admitting: *Deleted

## 2018-08-13 DIAGNOSIS — N939 Abnormal uterine and vaginal bleeding, unspecified: Secondary | ICD-10-CM

## 2018-08-13 MED ORDER — MEGESTROL ACETATE 40 MG PO TABS: 80.0000 mg | ORAL_TABLET | Freq: Two times a day (BID) | ORAL | 5 refills | Status: DC

## 2019-02-07 ENCOUNTER — Other Ambulatory Visit: Payer: Self-pay | Admitting: Obstetrics & Gynecology

## 2019-02-07 DIAGNOSIS — N939 Abnormal uterine and vaginal bleeding, unspecified: Secondary | ICD-10-CM

## 2019-04-10 ENCOUNTER — Encounter: Payer: Self-pay | Admitting: Radiology

## 2019-05-21 ENCOUNTER — Ambulatory Visit: Payer: Medicaid Other | Admitting: Obstetrics and Gynecology

## 2019-09-12 ENCOUNTER — Encounter: Payer: Self-pay | Admitting: Obstetrics and Gynecology

## 2019-09-12 ENCOUNTER — Other Ambulatory Visit: Payer: Self-pay

## 2019-09-12 ENCOUNTER — Ambulatory Visit (INDEPENDENT_AMBULATORY_CARE_PROVIDER_SITE_OTHER): Payer: Medicaid Other | Admitting: Obstetrics and Gynecology

## 2019-09-12 ENCOUNTER — Encounter (INDEPENDENT_AMBULATORY_CARE_PROVIDER_SITE_OTHER): Payer: Self-pay

## 2019-09-12 ENCOUNTER — Other Ambulatory Visit (HOSPITAL_COMMUNITY)
Admission: RE | Admit: 2019-09-12 | Discharge: 2019-09-12 | Disposition: A | Discharge status: Home / Self Care | Payer: Medicaid Other | Source: Ambulatory Visit | Attending: Obstetrics and Gynecology | Admitting: Obstetrics and Gynecology

## 2019-09-12 VITALS — BP 132/84 | HR 74 | Wt 336.2 lb

## 2019-09-12 DIAGNOSIS — Z01419 Encounter for gynecological examination (general) (routine) without abnormal findings: Secondary | ICD-10-CM | POA: Insufficient documentation

## 2019-09-12 DIAGNOSIS — Z8742 Personal history of other diseases of the female genital tract: Secondary | ICD-10-CM

## 2019-09-12 DIAGNOSIS — Z0000 Encounter for general adult medical examination without abnormal findings: Secondary | ICD-10-CM | POA: Diagnosis not present

## 2019-09-12 DIAGNOSIS — N8003 Adenomyosis of the uterus: Secondary | ICD-10-CM

## 2019-09-12 MED ORDER — NORETHINDRONE ACETATE 5 MG PO TABS: 5.0000 mg | ORAL_TABLET | Freq: Every day | ORAL | 6 refills | Status: DC

## 2019-09-12 NOTE — Progress Notes (Signed)
Obstetrics and Gynecology Annual Patient Evaluation  Appointment Date: 09/12/2019  OBGYN Clinic: [facility]  Primary Care Provider: Quentin Angst  Chief Complaint:  Chief Complaint  Patient presents with  . Gynecologic Exam    History of Present Illness: Sara Moore is a 34 y.o. African-American G2P1011 (LMP: amenorrhea), seen for the above chief complaint. Her past medical history is significant for BMI 50s, HTN, h/o AUB, adenomyosis, h/o c-section x 1.  She has been on bid megace for several years to tx her AUB and she has no problems or issues with it.    Review of Systems: Pertinent items noted in HPI and remainder of comprehensive ROS otherwise negative.   Past Medical History:  Past Medical History:  Diagnosis Date  . Anemia   . Hypertension   . Vaginal Pap smear, abnormal     Past Surgical History:  Past Surgical History:  Procedure Laterality Date  . CESAREAN SECTION      Past Obstetrical History:  OB History  Gravida Para Term Preterm AB Living  2 1 1  0 1 1  SAB TAB Ectopic Multiple Live Births  0 1 0 0      # Outcome Date GA Lbr Len/2nd Weight Sex Delivery Anes PTL Lv  2 Term 10/03/08 [redacted]w[redacted]d   F CS-Unspec        Birth Comments: Pre-eclampsia   1 TAB             Past Gynecological History: As per HPI. History of Pap Smear(s): Yes.   Last pap 2018, which was negative and hpv negative She is currently using no method for contraception.   Social History:  Social History   Socioeconomic History  . Marital status: Legally Separated    Spouse name: Not on file  . Number of children: Not on file  . Years of education: Not on file  . Highest education level: Not on file  Occupational History  . Not on file  Tobacco Use  . Smoking status: Never Smoker  . Smokeless tobacco: Never Used  Substance and Sexual Activity  . Alcohol use: No    Alcohol/week: 0.0 standard drinks  . Drug use: Yes    Frequency: 7.0  times per week    Types: Marijuana  . Sexual activity: Yes    Birth control/protection: Condom, Pill  Other Topics Concern  . Not on file  Social History Narrative  . Not on file   Social Determinants of Health   Financial Resource Strain:   . Difficulty of Paying Living Expenses:   Food Insecurity:   . Worried About 2019 in the Last Year:   . Programme researcher, broadcasting/film/video in the Last Year:   Transportation Needs:   . Barista (Medical):   Freight forwarder Lack of Transportation (Non-Medical):   Physical Activity:   . Days of Exercise per Week:   . Minutes of Exercise per Session:   Stress:   . Feeling of Stress :   Social Connections:   . Frequency of Communication with Friends and Family:   . Frequency of Social Gatherings with Friends and Family:   . Attends Religious Services:   . Active Member of Clubs or Organizations:   . Attends Marland Kitchen Meetings:   Banker Marital Status:   Intimate Partner Violence:   . Fear of Current or Ex-Partner:   . Emotionally Abused:   Marland Kitchen Physically Abused:   . Sexually Abused:  Family History:  Family History  Problem Relation Age of Onset  . Hypertension Mother   . Diabetes Father   . Hypertension Father    She denies any female cancers, bleeding or blood clotting disorders.   Medications Tamyia S. Macioce had no medications administered during this visit. Current Outpatient Medications  Medication Sig Dispense Refill  . amLODipine-benazepril (LOTREL) 5-10 MG capsule Take 1 capsule by mouth daily.    Marland Kitchen liraglutide (VICTOZA) 18 MG/3ML SOPN Inject 18 mg into the skin once.    Marland Kitchen lisinopril-hydrochlorothiazide (PRINZIDE,ZESTORETIC) 20-12.5 MG tablet Take 1 tablet by mouth daily.    . megestrol (MEGACE) 40 MG tablet 80mg  PO BID     No current facility-administered medications for this visit.    Allergies Patient has no known allergies.   Physical Exam:  BP 132/84   Pulse 74   Wt (!) 336 lb 3.2 oz (152.5 kg)    LMP  (LMP Unknown)   BMI 49.65 kg/m  Body mass index is 49.65 kg/m. General appearance: Well nourished, well developed female in no acute distress.  Neck:  Supple, normal appearance, and no thyromegaly  Cardiovascular: normal s1 and s2.  No murmurs, rubs or gallops. Respiratory:  Clear to auscultation bilateral. Normal respiratory effort Abdomen: positive bowel sounds and no masses, hernias; diffusely non tender to palpation, non distended Breasts: breasts appear normal, no suspicious masses, no skin or nipple changes or axillary nodes, and negative palpation. Neuro/Psych:  Normal mood and affect.  Skin:  Warm and dry.  Lymphatic:  No inguinal lymphadenopathy.   Pelvic exam: is not limited by body habitus EGBUS: within normal limits Vagina: within normal limits and with no blood or discharge in the vault Cervix: normal appearing cervix without tenderness, discharge or lesions. Uterus:  nonenlarged and non tender Adnexa:  normal adnexa and no mass, fullness, tenderness Rectovaginal: deferred  Laboratory: none  Radiology: none  Assessment: pt doing well  Plan:  1. AUB Her weight is up even more than last year, which I told her that a big SE of megace is that it can cause weight gain via appetite stimulation. I also reiterated to her again that megace isn't considered BC. I d/w her re: a trial of different method and she would like to keep on pills. I metnioned to her re: Aygestin and she is amenable to trying that. Will start at 5mg  po qday; I also told her that this can also be considered BC. Follow up beta hcg today.   2. Well woman exam with routine gynecological exam Routine care. Pt would like STI labs.     RTC PRN, 1 year  Aletha Halim, Brooke Bonito MD Attending Center for [employer] Fish farm manager)

## 2019-09-12 NOTE — Progress Notes (Signed)
Last pap 2018- normal  Sti testing today 1 c-section

## 2019-09-13 LAB — HEPATITIS B SURFACE ANTIGEN: Hepatitis B Surface Ag: NEGATIVE

## 2019-09-13 LAB — HIV ANTIBODY (ROUTINE TESTING W REFLEX): HIV Screen 4th Generation wRfx: NONREACTIVE

## 2019-09-13 LAB — HEPATITIS C ANTIBODY (REFLEX): HCV Ab: <0.1 {s_co_ratio} (ref 0.0–0.9)

## 2019-09-13 LAB — BETA HCG QUANT (REF LAB): hCG Quant: <1 m[IU]/mL

## 2019-09-13 LAB — SYPHILIS: RPR W/REFLEX TO RPR TITER AND TREPONEMAL ANTIBODIES, TRADITIONAL SCREENING AND DIAGNOSIS ALGORITHM: RPR Ser Ql: NONREACTIVE

## 2019-09-13 LAB — HCV COMMENT:

## 2019-09-16 LAB — CERVICOVAGINAL ANCILLARY ONLY
Bacterial Vaginitis (gardnerella): NEGATIVE
Candida Glabrata: NEGATIVE
Candida Vaginitis: NEGATIVE
Chlamydia: NEGATIVE
Comment: NEGATIVE
Comment: NEGATIVE
Comment: NEGATIVE
Comment: NEGATIVE
Comment: NEGATIVE
Comment: NORMAL
Neisseria Gonorrhea: NEGATIVE
Trichomonas: NEGATIVE

## 2019-10-21 ENCOUNTER — Other Ambulatory Visit: Payer: Self-pay

## 2019-10-21 DIAGNOSIS — Z01419 Encounter for gynecological examination (general) (routine) without abnormal findings: Secondary | ICD-10-CM

## 2019-10-21 DIAGNOSIS — Z8742 Personal history of other diseases of the female genital tract: Secondary | ICD-10-CM

## 2019-10-21 MED ORDER — NORETHINDRONE ACETATE 5 MG PO TABS: 5.0000 mg | ORAL_TABLET | Freq: Every day | ORAL | 6 refills | Status: DC

## 2019-10-21 NOTE — Telephone Encounter (Signed)
Patient is requesting a refill on Aygestin to be called into her pharmacy. She reports that the provider told her to take it differently then what the insurance will cover and this is way she needs another refill.

## 2019-11-12 ENCOUNTER — Ambulatory Visit (HOSPITAL_COMMUNITY)
Admission: RE | Admit: 2019-11-12 | Discharge: 2019-11-12 | Disposition: A | Discharge status: Home / Self Care | Payer: Medicaid Other | Source: Ambulatory Visit | Attending: Nurse Practitioner | Admitting: Nurse Practitioner

## 2019-11-12 ENCOUNTER — Other Ambulatory Visit (HOSPITAL_COMMUNITY): Payer: Self-pay | Admitting: Nurse Practitioner

## 2019-11-12 DIAGNOSIS — M542 Cervicalgia: Secondary | ICD-10-CM | POA: Insufficient documentation

## 2020-01-27 ENCOUNTER — Encounter: Payer: Self-pay | Admitting: *Deleted

## 2020-01-28 ENCOUNTER — Other Ambulatory Visit: Payer: Self-pay | Admitting: *Deleted

## 2020-01-28 DIAGNOSIS — N760 Acute vaginitis: Secondary | ICD-10-CM

## 2020-01-28 MED ORDER — METRONIDAZOLE 500 MG PO TABS: 500.0000 mg | ORAL_TABLET | Freq: Two times a day (BID) | ORAL | 0 refills | Status: DC

## 2020-02-24 ENCOUNTER — Other Ambulatory Visit: Payer: Self-pay

## 2020-02-24 DIAGNOSIS — Z01419 Encounter for gynecological examination (general) (routine) without abnormal findings: Secondary | ICD-10-CM

## 2020-02-24 DIAGNOSIS — Z8742 Personal history of other diseases of the female genital tract: Secondary | ICD-10-CM

## 2020-02-24 MED ORDER — NORETHINDRONE ACETATE 5 MG PO TABS: 5.0000 mg | ORAL_TABLET | Freq: Two times a day (BID) | ORAL | 11 refills | Status: DC

## 2020-02-24 NOTE — Telephone Encounter (Signed)
Pt called requesting refills on Norethindrone but states pharmacy told her Dr. Vergie Living needs to reorder the medication. Advised pt I would speak with him and have it sent in. Pt voiced understanding.

## 2020-04-03 ENCOUNTER — Other Ambulatory Visit: Payer: Self-pay | Admitting: Obstetrics & Gynecology

## 2020-04-03 DIAGNOSIS — B9689 Other specified bacterial agents as the cause of diseases classified elsewhere: Secondary | ICD-10-CM

## 2020-07-27 ENCOUNTER — Encounter: Payer: Self-pay | Admitting: Radiology

## 2021-03-09 ENCOUNTER — Other Ambulatory Visit: Payer: Self-pay | Admitting: Obstetrics and Gynecology

## 2021-03-09 DIAGNOSIS — Z8742 Personal history of other diseases of the female genital tract: Secondary | ICD-10-CM

## 2021-03-09 DIAGNOSIS — Z01419 Encounter for gynecological examination (general) (routine) without abnormal findings: Secondary | ICD-10-CM

## 2021-04-01 ENCOUNTER — Ambulatory Visit (INDEPENDENT_AMBULATORY_CARE_PROVIDER_SITE_OTHER): Payer: Medicaid Other | Admitting: Obstetrics and Gynecology

## 2021-04-01 ENCOUNTER — Other Ambulatory Visit (HOSPITAL_COMMUNITY)
Admission: RE | Admit: 2021-04-01 | Discharge: 2021-04-01 | Disposition: A | Discharge status: Home / Self Care | Payer: Medicaid Other | Source: Ambulatory Visit | Attending: Obstetrics and Gynecology | Admitting: Obstetrics and Gynecology

## 2021-04-01 ENCOUNTER — Other Ambulatory Visit: Payer: Self-pay

## 2021-04-01 VITALS — BP 136/86 | HR 102 | Wt 325.0 lb

## 2021-04-01 DIAGNOSIS — Z01419 Encounter for gynecological examination (general) (routine) without abnormal findings: Secondary | ICD-10-CM

## 2021-04-01 DIAGNOSIS — N898 Other specified noninflammatory disorders of vagina: Secondary | ICD-10-CM | POA: Insufficient documentation

## 2021-04-01 DIAGNOSIS — N8003 Adenomyosis of the uterus: Secondary | ICD-10-CM | POA: Diagnosis not present

## 2021-04-01 DIAGNOSIS — Z8742 Personal history of other diseases of the female genital tract: Secondary | ICD-10-CM | POA: Diagnosis not present

## 2021-04-01 MED ORDER — NORETHINDRONE ACETATE 5 MG PO TABS: 5.0000 mg | ORAL_TABLET | Freq: Two times a day (BID) | ORAL | 7 refills | Status: DC

## 2021-04-01 NOTE — Progress Notes (Signed)
Obstetrics and Gynecology Annuall Patient Evaluation  Appointment Date: 04/01/2021  OBGYN Clinic: [facility]  Primary Care Provider: Jeanann Lewandowsky E   Chief Complaint: annual exam  History of Present Illness: Sara Moore is a 35 y.o. G2P1011 (LMP: none due to POPs), seen for the above chief complaint. Her past medical history is significant for BMI 50s, HTN, h/o AUB, adenomyosis on u/s, h/o c-section x 1.  New vaginal discharge and odor after her and husband tried new lubricant.   Has been on aygestin 5mg  po bid since last year and is doing well on that (no bleeding or periods); previusly she had been on megace but switched due to weight gain   Review of Systems: Pertinent items noted in HPI and remainder of comprehensive ROS otherwise negative.    Patient Active Problem List   Diagnosis Date Noted   Adenomyoma 09/12/2019   History of abnormal uterine bleeding 09/12/2019   BMI 40.0-44.9, adult (HCC) 05/10/2018   Abnormal uterine bleeding (AUB) 12/10/2015   Accelerated hypertension 03/04/2013   Dyslipidemia 03/04/2013   Morbid obesity (HCC) 03/04/2013    Past Medical History:  Past Medical History:  Diagnosis Date   Anemia    Hypertension    Vaginal Pap smear, abnormal     Past Surgical History:  Past Surgical History:  Procedure Laterality Date   CESAREAN SECTION      Past Obstetrical History:  OB History  Gravida Para Term Preterm AB Living  2 1 1  0 1 1  SAB IAB Ectopic Multiple Live Births  0 1 0 0      # Outcome Date GA Lbr Len/2nd Weight Sex Delivery Anes PTL Lv  2 Term 10/03/08 [redacted]w[redacted]d   F CS-Unspec        Birth Comments: Pre-eclampsia   1 IAB             Past Gynecological History: As per HPI Periods: none History of Pap Smear(s): Yes.   Last pap 2018, which was negative cytology and hpv She is currently using oral progesterone-only contraceptive for contraception.   Social History:  Social History    Socioeconomic History   Marital status: Legally Separated    Spouse name: Not on file   Number of children: Not on file   Years of education: Not on file   Highest education level: Not on file  Occupational History   Not on file  Tobacco Use   Smoking status: Never   Smokeless tobacco: Never  Substance and Sexual Activity   Alcohol use: No    Alcohol/week: 0.0 standard drinks   Drug use: Yes    Frequency: 7.0 times per week    Types: Marijuana   Sexual activity: Yes    Birth control/protection: Condom, Pill  Other Topics Concern   Not on file  Social History Narrative   Not on file   Social Determinants of Health   Financial Resource Strain: Not on file  Food Insecurity: Not on file  Transportation Needs: Not on file  Physical Activity: Not on file  Stress: Not on file  Social Connections: Not on file  Intimate Partner Violence: Not on file    Family History:  Family History  Problem Relation Age of Onset   Hypertension Mother    Diabetes Father    Hypertension Father     Medications Linnet S. Kutsch had no medications administered during this visit. Current Outpatient Medications  Medication Sig Dispense Refill   amLODipine-benazepril (LOTREL)  5-10 MG capsule Take 1 capsule by mouth daily.     liraglutide (VICTOZA) 18 MG/3ML SOPN Inject 18 mg into the skin once.     lisinopril-hydrochlorothiazide (PRINZIDE,ZESTORETIC) 20-12.5 MG tablet Take 1 tablet by mouth daily.     norethindrone (AYGESTIN) 5 MG tablet TAKE 1 TABLET BY MOUTH TWICE A DAY 60 tablet 0   No current facility-administered medications for this visit.    Allergies Patient has no known allergies.   Physical Exam:  BP 136/86   Pulse (!) 102   Wt (!) 325 lb (147.4 kg)   LMP  (LMP Unknown)   BMI 47.99 kg/m  Body mass index is 47.99 kg/m. Weight last year: 336lbs General appearance: Well nourished, well developed female in no acute distress.  Neck:  Supple, normal appearance, and no  thyromegaly  Cardiovascular: normal s1 and s2.  No murmurs, rubs or gallops. Respiratory:  Clear to auscultation bilateral. Normal respiratory effort Abdomen: positive bowel sounds and no masses, hernias; diffusely non tender to palpation, non distended Breasts: breasts appear normal, no suspicious masses, no skin or nipple changes or axillary nodes, and normal palpation. Neuro/Psych:  Normal mood and affect.  Skin:  Warm and dry.  Lymphatic:  No inguinal lymphadenopathy.   Pelvic exam: is limited by body habitus EGBUS: within normal limits Vagina:  no blood in the vault; white d/c in vault Cervix: normal appearing cervix without tenderness, discharge or lesions.  Uterus:  nonenlarged and non tender Adnexa:  normal adnexa and no mass, fullness, tenderness Rectovaginal: deferred  Laboratory: none  Radiology: none  Assessment: pt stable  Plan:  1. Vaginal discharge - Cervicovaginal ancillary only( [place])  2. Well woman exam with routine gynecological exam Routine care. Pap smear next year. Aygestin bid sent in - Cervicovaginal ancillary only( [place])  RTC 1year  Cornelia Copa MD Attending [facility])

## 2021-04-02 LAB — CERVICOVAGINAL ANCILLARY ONLY
Bacterial Vaginitis (gardnerella): NEGATIVE
Candida Glabrata: NEGATIVE
Candida Vaginitis: NEGATIVE
Chlamydia: NEGATIVE
Comment: NEGATIVE
Comment: NEGATIVE
Comment: NEGATIVE
Comment: NEGATIVE
Comment: NEGATIVE
Comment: NORMAL
Neisseria Gonorrhea: NEGATIVE
Trichomonas: NEGATIVE

## 2021-05-15 IMAGING — DX DG THORACIC SPINE 2V
3 series · 3 of 3 positions shown · non-contrast
Comparison: None.

CLINICAL DATA: Cervicalgia. Posterior neck and low back pain.
Tenderness in both arms and legs. No known injury.

EXAM:
THORACIC SPINE 2 VIEWS

[t thoracic spine ap]
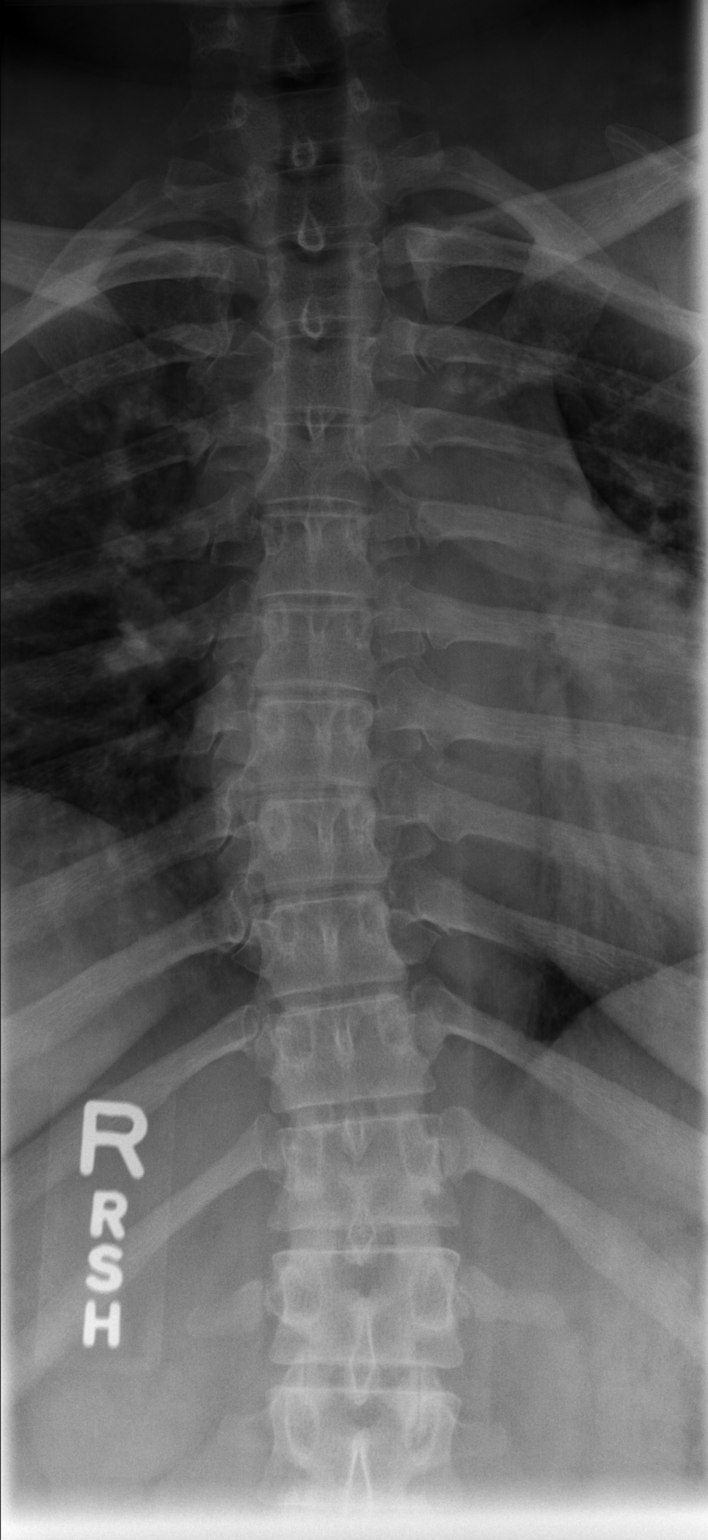

[t thoracic breathing lat (1 of 2)]
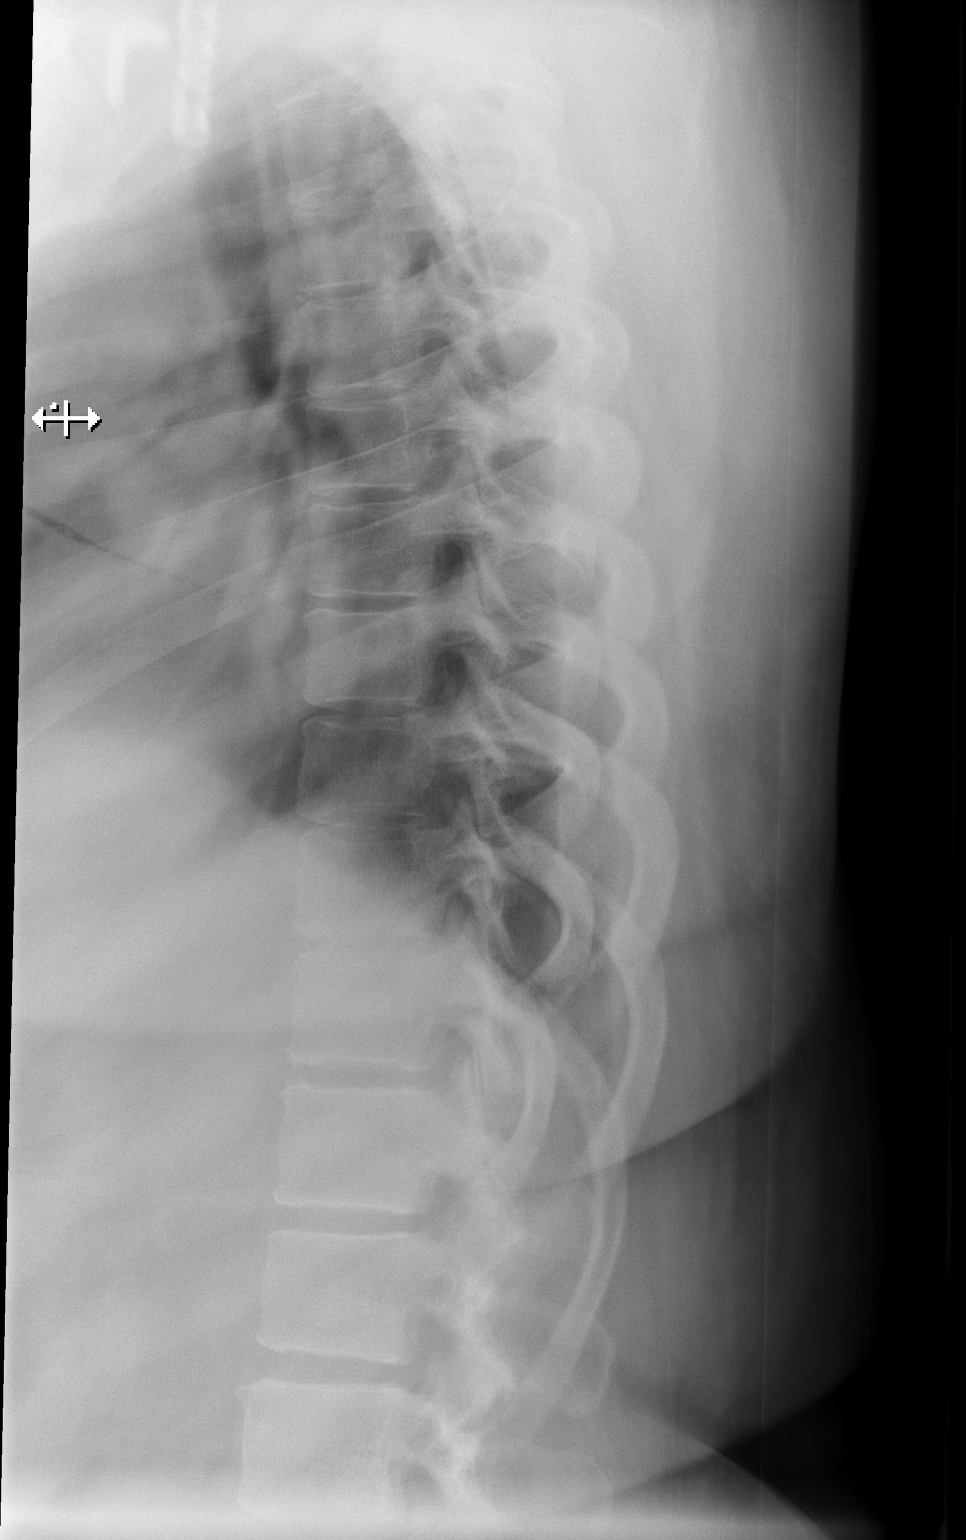

[t thoracic breathing lat (2 of 2)]
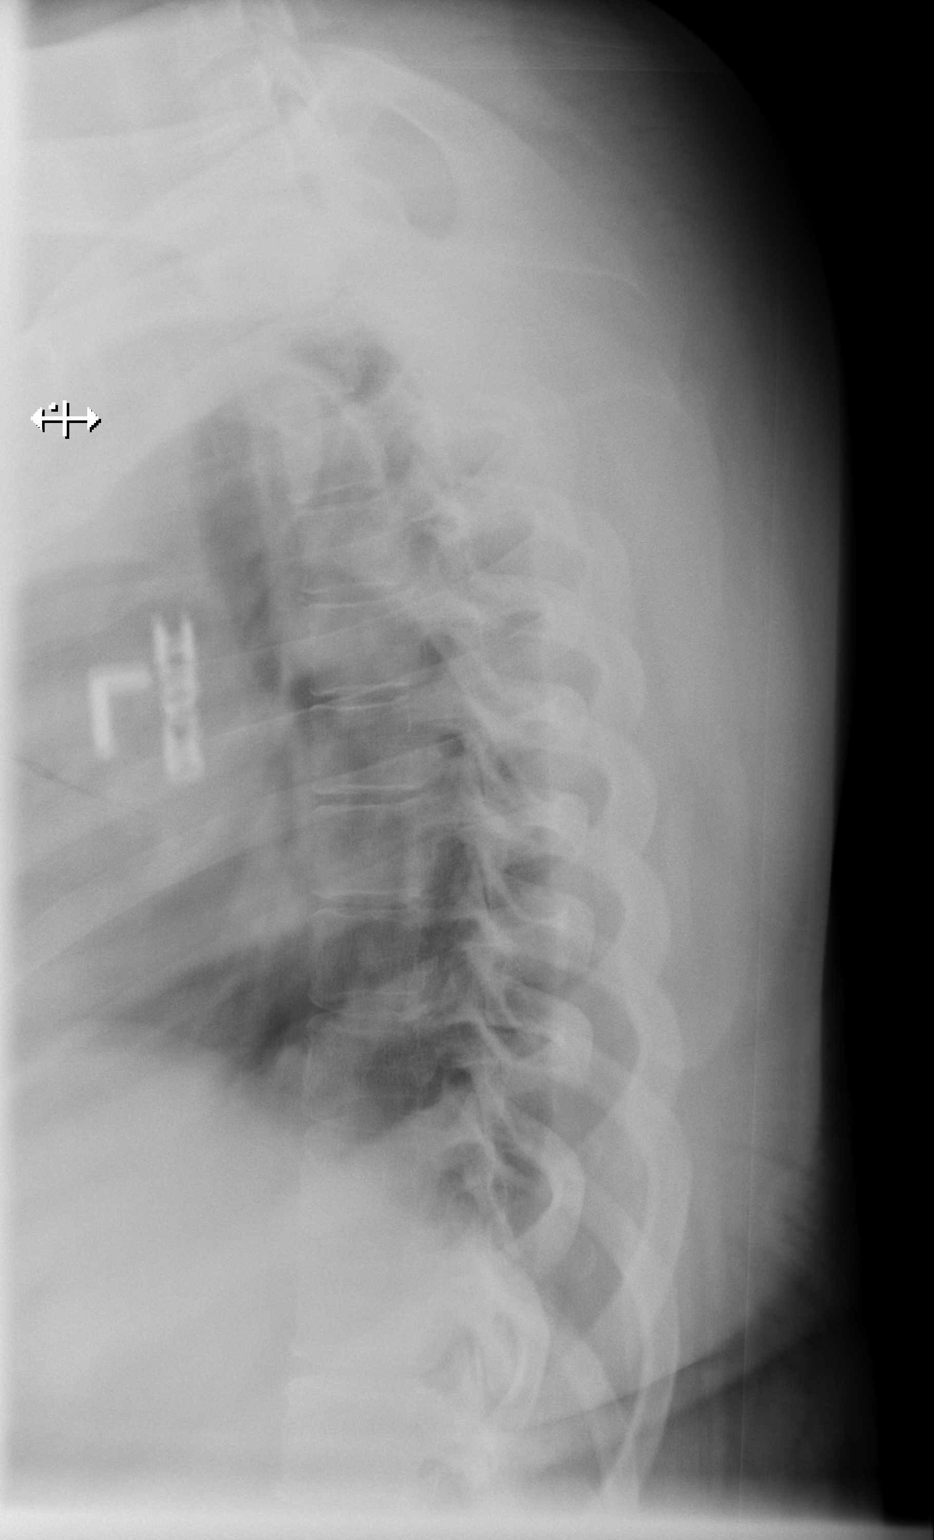

[3 of 3 positions shown; findings below may reference images not displayed]

FINDINGS: There are 12 rib-bearing thoracic type vertebral bodies with small
ribs at T12. There is a mild convex right scoliosis centered at
T8-9. The lateral alignment is normal. No evidence of acute
fracture, paraspinal hematoma or widening of the interpedicular
distance. No vertebral anomalies are seen.
IMPRESSION: Mild convex right scoliosis. No acute osseous findings or
significant malalignment.

## 2021-06-07 ENCOUNTER — Emergency Department
Admission: EM | Admit: 2021-06-07 | Discharge: 2021-06-07 | Disposition: A | Discharge status: Home / Self Care | Payer: Medicaid Other | Attending: Emergency Medicine | Admitting: Emergency Medicine

## 2021-06-07 ENCOUNTER — Emergency Department: Payer: Medicaid Other

## 2021-06-07 ENCOUNTER — Other Ambulatory Visit: Payer: Self-pay

## 2021-06-07 DIAGNOSIS — K610 Anal abscess: Secondary | ICD-10-CM | POA: Diagnosis present

## 2021-06-07 DIAGNOSIS — Z79899 Other long term (current) drug therapy: Secondary | ICD-10-CM | POA: Diagnosis not present

## 2021-06-07 LAB — CBC
HCT: 43.7 % (ref 36.0–46.0)
Hemoglobin: 14.2 g/dL (ref 12.0–15.0)
MCH: 28.9 pg (ref 26.0–34.0)
MCHC: 32.5 g/dL (ref 30.0–36.0)
MCV: 89 fL (ref 80.0–100.0)
Platelets: 281 10*3/uL (ref 150–400)
RBC: 4.91 MIL/uL (ref 3.87–5.11)
RDW: 13.3 % (ref 11.5–15.5)
WBC: 12 10*3/uL — ABNORMAL HIGH (ref 4.0–10.5)
nRBC: 0 % (ref 0.0–0.2)

## 2021-06-07 LAB — URINALYSIS, COMPLETE (UACMP) WITH MICROSCOPIC
Bilirubin Urine: NEGATIVE
Glucose, UA: NEGATIVE mg/dL
Ketones, ur: NEGATIVE mg/dL
Nitrite: NEGATIVE
Protein, ur: 30 mg/dL — AB
Specific Gravity, Urine: 1.025 (ref 1.005–1.030)
pH: 7 (ref 5.0–8.0)

## 2021-06-07 LAB — LACTIC ACID, PLASMA: Lactic Acid, Venous: 1.7 mmol/L (ref 0.5–1.9)

## 2021-06-07 LAB — BASIC METABOLIC PANEL
Anion gap: 8 (ref 5–15)
BUN: 13 mg/dL (ref 6–20)
CO2: 26 mmol/L (ref 22–32)
Calcium: 8.9 mg/dL (ref 8.9–10.3)
Chloride: 103 mmol/L (ref 98–111)
Creatinine, Ser: 0.85 mg/dL (ref 0.44–1.00)
GFR, Estimated: >60 mL/min (ref >60)
Glucose, Bld: 96 mg/dL (ref 70–99)
Potassium: 3.4 mmol/L — ABNORMAL LOW (ref 3.5–5.1)
Sodium: 137 mmol/L (ref 135–145)

## 2021-06-07 LAB — POC URINE PREG, ED: Preg Test, Ur: NEGATIVE

## 2021-06-07 MED ORDER — IOHEXOL 350 MG/ML SOLN
100.0000 mL | Freq: Once | INTRAVENOUS | Status: AC | PRN
  Administered 2021-06-07: 100 mL via INTRAVENOUS
  Filled 2021-06-07: qty 100

## 2021-06-07 MED ORDER — SODIUM CHLORIDE 0.9 % IV BOLUS
1000.0000 mL | Freq: Once | INTRAVENOUS | Status: AC
  Administered 2021-06-07: 1000 mL via INTRAVENOUS

## 2021-06-07 MED ORDER — HYDROCODONE-ACETAMINOPHEN 5-325 MG PO TABS
1.0000 | ORAL_TABLET | ORAL | Status: AC
Start: 2021-06-07 — End: 2021-06-07
  Administered 2021-06-07: 1 via ORAL
  Filled 2021-06-07: qty 1

## 2021-06-07 MED ORDER — AMOXICILLIN-POT CLAVULANATE 875-125 MG PO TABS
1.0000 | ORAL_TABLET | Freq: Once | ORAL | Status: AC
Start: 2021-06-07 — End: 2021-06-07
  Administered 2021-06-07: 1 via ORAL
  Filled 2021-06-07: qty 1

## 2021-06-07 MED ORDER — HYDROCODONE-ACETAMINOPHEN 5-325 MG PO TABS: 1.0000 | ORAL_TABLET | Freq: Four times a day (QID) | ORAL | 0 refills | Status: DC | PRN

## 2021-06-07 MED ORDER — AMOXICILLIN-POT CLAVULANATE 875-125 MG PO TABS: 1.0000 | ORAL_TABLET | Freq: Two times a day (BID) | ORAL | 0 refills | Status: AC

## 2021-06-07 NOTE — Discharge Instructions (Signed)
You may try sitting in a warm sitz bath for pain relief.  Take Norco as needed for pain.  Take Augmentin as prescribed.  Please call general surgeon's office first thing tomorrow morning to schedule follow-up appointment.  Return to the ER for any increasing pain swelling fevers, worsening symptoms or urgent changes in her health.

## 2021-06-07 NOTE — ED Provider Notes (Signed)
[facility] EMERGENCY DEPARTMENT Provider Note   CSN: [id number] Arrival date & time: 06/07/21  1604     History  Chief Complaint  Patient presents with   Foreign Body in Rectum    Sara Moore is a 36 y.o. female.  With past medical history of obesity, hypertension, abnormal uterine bleeding presents to the emergency department for evaluation of rectal pain/swelling.  Just superior to the rectum she is complaining of pain x1 week this been increasing.  No fevers, drainage.  No nausea, vomiting or belly pain.  She denies any rectal foreign bodies.  Pain is moderate to severe and affects her ability to walk and stand.  She denies any numbness or tingling.  HPI     Home Medications Prior to Admission medications   Medication Sig Start Date End Date Taking? Authorizing Provider  amoxicillin-clavulanate (AUGMENTIN) 875-125 MG tablet Take 1 tablet by mouth every 12 (twelve) hours for 7 days. 06/07/21 06/14/21 Yes Duanne Guess, PA-C  HYDROcodone-acetaminophen (NORCO) 5-325 MG tablet Take 1 tablet by mouth every 6 (six) hours as needed for moderate pain. 06/07/21  Yes Duanne Guess, PA-C  amLODipine-benazepril (LOTREL) 5-10 MG capsule Take 1 capsule by mouth daily.    [provider]  liraglutide (VICTOZA) 18 MG/3ML SOPN Inject 18 mg into the skin once.    [provider]  lisinopril-hydrochlorothiazide (PRINZIDE,ZESTORETIC) 20-12.5 MG tablet Take 1 tablet by mouth daily.    [provider]  metroNIDAZOLE (FLAGYL) 500 MG tablet TAKE 1 TABLET BY MOUTH TWICE A DAY 04/06/20   Anyanwu, Sallyanne Havers, MD  norethindrone (AYGESTIN) 5 MG tablet Take 1 tablet (5 mg total) by mouth 2 (two) times daily. 04/01/21   Aletha Halim, MD      Allergies    Patient has no known allergies.    Review of Systems   Review of Systems  Constitutional:  Negative for fever.  Respiratory:  Negative for shortness of breath.   Cardiovascular:  Negative for chest  pain.  Gastrointestinal:  Positive for rectal pain. Negative for abdominal pain, blood in stool and constipation.  Genitourinary:  Negative for difficulty urinating, dysuria and urgency.  Musculoskeletal:  Negative for back pain and myalgias.  Skin:  Negative for rash.  Neurological:  Negative for dizziness and headaches.   Physical Exam Updated Vital Signs BP 127/66    Pulse 81    Temp 98.4 F (36.9 C) (Oral)    Resp 18    Ht 5\' 9"  (1.753 m)    Wt (!) 146.1 kg    SpO2 96%    BMI 47.55 kg/m  Physical Exam Constitutional:      Appearance: She is well-developed.  HENT:     Head: Normocephalic and atraumatic.  Eyes:     Conjunctiva/sclera: Conjunctivae normal.  Cardiovascular:     Rate and Rhythm: Normal rate.  Pulmonary:     Effort: Pulmonary effort is normal. No respiratory distress.  Abdominal:     General: There is no distension.     Tenderness: There is no abdominal tenderness. There is no guarding.  Genitourinary:    Comments: Perirectal area appears well with no sign of hemorrhoids, perirectal induration/fluctuance.  Mild erythema 3 to 4 inches above the rectum with mild redness with left and right sided tenderness along the gluteal cleft.  No signs of pilonidal abscess. Musculoskeletal:        General: Normal range of motion.     Cervical back: Normal range of  motion.  Skin:    General: Skin is warm.     Findings: No rash.  Neurological:     Mental Status: She is alert and oriented to person, place, and time.  Psychiatric:        Behavior: Behavior normal.        Thought Content: Thought content normal.    ED Results / Procedures / Treatments   Labs (all labs ordered are listed, but only abnormal results are displayed) Labs Reviewed  CBC - Abnormal; Notable for the following components:      Result Value   WBC 12.0 (*)    All other components within normal limits  BASIC METABOLIC PANEL - Abnormal; Notable for the following components:   Potassium 3.4 (*)    All  other components within normal limits  URINALYSIS, COMPLETE (UACMP) WITH MICROSCOPIC - Abnormal; Notable for the following components:   APPearance HAZY (*)    Hgb urine dipstick MODERATE (*)    Protein, ur 30 (*)    Leukocytes,Ua TRACE (*)    Bacteria, UA MANY (*)    All other components within normal limits  LACTIC ACID, PLASMA  LACTIC ACID, PLASMA  POC URINE PREG, ED    EKG None  Radiology CT PELVIS W CONTRAST  Result Date: 06/07/2021 CLINICAL DATA:  Pain in the anorectal region EXAM: CT PELVIS WITH CONTRAST TECHNIQUE: Multidetector CT imaging of the pelvis was performed using the standard protocol following the bolus administration of intravenous contrast. RADIATION DOSE REDUCTION: This exam was performed according to the departmental dose-optimization program which includes automated exposure control, adjustment of the mA and/or kV according to patient size and/or use of iterative reconstruction technique. CONTRAST:  118mL OMNIPAQUE IOHEXOL 350 MG/ML SOLN COMPARISON:  None. FINDINGS: There is no free fluid in the pelvis. Bowel loops in the pelvis are unremarkable. There is slightly inhomogeneous attenuation in the uterus. There are no dominant adnexal masses. There is no significant wall thickening in the rectum. There is 3.3 x 1.5 cm loculated thick-walled fluid collection posterior to the anus. There is umbilical hernia containing fat. Bony structures are unremarkable. Musculoskeletal: Unremarkable. IMPRESSION: There is a 3.3 x 1.5 cm thick-walled loculated fluid collection posterior to the anus suggesting perianal abscess. There is no significant wall thickening in the rectum. There is no perirectal fluid collection in the pelvic cavity. Umbilical hernia containing fat is seen. Electronically Signed   By: Elmer Picker M.D.   On: 06/07/2021 17:51    Procedures Procedures    Medications Ordered in ED Medications  sodium chloride 0.9 % bolus 1,000 mL (1,000 mLs Intravenous New  Bag/Given 06/07/21 1714)  HYDROcodone-acetaminophen (NORCO/VICODIN) 5-325 MG per tablet 1 tablet (1 tablet Oral Given 06/07/21 1704)  iohexol (OMNIPAQUE) 350 MG/ML injection 100 mL (100 mLs Intravenous Contrast Given 06/07/21 1737)  amoxicillin-clavulanate (AUGMENTIN) 875-125 MG per tablet 1 tablet (1 tablet Oral Given 06/07/21 1834)    ED Course/ Medical Decision Making/ A&P                           Medical Decision Making Amount and/or Complexity of Data Reviewed Labs: ordered. Radiology: ordered.  Risk Prescription drug management.   36 year old female with perianal abscess.  Has had pain going on 2 weeks.  Vital signs are stable, afebrile.  Slight elevated white count at 12.0 with normal lactic acid.  Pain well controlled as long as she is not sitting on her bottom.  She is  given Norco for pain.  CT scan reviewed by me today shows 3.3 x 1.5 cm perianal abscess.  Discussed with general surgeon on-call who recommended outpatient follow-up tomorrow to perform physical exam and discuss treatment options.  Patient medically stable and ready for discharge to home.  She understands signs symptoms return to the ER for.   Final Clinical Impression(s) / ED Diagnoses Final diagnoses:  Perianal abscess    Rx / DC Orders ED Discharge Orders          Ordered    HYDROcodone-acetaminophen (NORCO) 5-325 MG tablet  Every 6 hours PRN        06/07/21 1828    amoxicillin-clavulanate (AUGMENTIN) 875-125 MG tablet  Every 12 hours        06/07/21 1828              Sara Moore 06/07/21 Meade Maw, MD 06/07/21 2351

## 2021-06-07 NOTE — ED Triage Notes (Signed)
Pt comes with c/o rectum pain for two weeks. Pt went to UC and they stated something might be there inside so she needs a xray. Pt states some pain.

## 2021-06-08 ENCOUNTER — Ambulatory Visit
Admission: RE | Admit: 2021-06-08 | Discharge: 2021-06-08 | Disposition: A | Discharge status: Home / Self Care | Payer: Medicaid Other | Source: Ambulatory Visit | Attending: Surgery | Admitting: Surgery

## 2021-06-08 ENCOUNTER — Ambulatory Visit: Payer: Self-pay | Admitting: Surgery

## 2021-06-08 ENCOUNTER — Encounter
Admission: RE | Disposition: A | Discharge status: Home / Self Care | Payer: Self-pay | Source: Ambulatory Visit | Attending: Surgery

## 2021-06-08 ENCOUNTER — Other Ambulatory Visit: Payer: Self-pay

## 2021-06-08 ENCOUNTER — Encounter: Payer: Self-pay | Admitting: Surgery

## 2021-06-08 ENCOUNTER — Ambulatory Visit: Payer: Medicaid Other | Admitting: Registered Nurse

## 2021-06-08 DIAGNOSIS — K612 Anorectal abscess: Secondary | ICD-10-CM | POA: Insufficient documentation

## 2021-06-08 DIAGNOSIS — K611 Rectal abscess: Secondary | ICD-10-CM

## 2021-06-08 HISTORY — DX: Type 2 diabetes mellitus without complications: E11.9

## 2021-06-08 HISTORY — PX: INCISION AND DRAINAGE ABSCESS: SHX5864

## 2021-06-08 SURGERY — INCISION AND DRAINAGE, ABSCESS
Anesthesia: General | Site: Anus

## 2021-06-08 MED ORDER — PROPOFOL 10 MG/ML IV BOLUS
INTRAVENOUS | Status: AC
  Filled 2021-06-08: qty 40

## 2021-06-08 MED ORDER — ACETAMINOPHEN 10 MG/ML IV SOLN: 1000.0000 mg | Freq: Once | INTRAVENOUS | Status: DC | PRN

## 2021-06-08 MED ORDER — CHLORHEXIDINE GLUCONATE 0.12 % MT SOLN: 15.0000 mL | Freq: Once | OROMUCOSAL | Status: AC

## 2021-06-08 MED ORDER — ACETAMINOPHEN 500 MG PO TABS: 1000.0000 mg | ORAL_TABLET | Freq: Once | ORAL | Status: DC

## 2021-06-08 MED ORDER — 0.9 % SODIUM CHLORIDE (POUR BTL) OPTIME
TOPICAL | Status: DC | PRN
  Administered 2021-06-08: 500 mL

## 2021-06-08 MED ORDER — HYDROMORPHONE HCL 1 MG/ML IJ SOLN
INTRAMUSCULAR | Status: DC | PRN
  Administered 2021-06-08: .5 mg via INTRAVENOUS

## 2021-06-08 MED ORDER — CEFAZOLIN SODIUM-DEXTROSE 2-4 GM/100ML-% IV SOLN
2.0000 g | INTRAVENOUS | Status: AC
  Administered 2021-06-08: 2 g via INTRAVENOUS

## 2021-06-08 MED ORDER — MIDAZOLAM HCL 2 MG/2ML IJ SOLN
INTRAMUSCULAR | Status: DC | PRN
  Administered 2021-06-08: 2 mg via INTRAVENOUS

## 2021-06-08 MED ORDER — SUGAMMADEX SODIUM 500 MG/5ML IV SOLN
INTRAVENOUS | Status: AC
  Filled 2021-06-08: qty 5

## 2021-06-08 MED ORDER — GABAPENTIN 300 MG PO CAPS: 300.0000 mg | ORAL_CAPSULE | ORAL | Status: AC

## 2021-06-08 MED ORDER — DEXAMETHASONE SODIUM PHOSPHATE 10 MG/ML IJ SOLN
INTRAMUSCULAR | Status: DC | PRN
  Administered 2021-06-08: 10 mg via INTRAVENOUS

## 2021-06-08 MED ORDER — PROPOFOL 10 MG/ML IV BOLUS
INTRAVENOUS | Status: DC | PRN
  Administered 2021-06-08: 80 mg via INTRAVENOUS
  Administered 2021-06-08: 200 mg via INTRAVENOUS

## 2021-06-08 MED ORDER — ACETAMINOPHEN 325 MG PO TABS: 650.0000 mg | ORAL_TABLET | Freq: Three times a day (TID) | ORAL | 0 refills | Status: AC | PRN

## 2021-06-08 MED ORDER — OXYCODONE HCL 5 MG PO TABS
5.0000 mg | ORAL_TABLET | Freq: Once | ORAL | Status: AC | PRN
  Administered 2021-06-08: 5 mg via ORAL

## 2021-06-08 MED ORDER — FENTANYL CITRATE (PF) 100 MCG/2ML IJ SOLN
INTRAMUSCULAR | Status: DC | PRN
  Administered 2021-06-08 (×2): 50 ug via INTRAVENOUS

## 2021-06-08 MED ORDER — PHENYLEPHRINE HCL-NACL 20-0.9 MG/250ML-% IV SOLN
INTRAVENOUS | Status: AC
  Filled 2021-06-08: qty 250

## 2021-06-08 MED ORDER — IBUPROFEN 800 MG PO TABS: 800.0000 mg | ORAL_TABLET | Freq: Three times a day (TID) | ORAL | 0 refills | Status: DC | PRN

## 2021-06-08 MED ORDER — SODIUM CHLORIDE 0.9 % IV SOLN: INTRAVENOUS | Status: DC

## 2021-06-08 MED ORDER — ORAL CARE MOUTH RINSE: 15.0000 mL | Freq: Once | OROMUCOSAL | Status: AC

## 2021-06-08 MED ORDER — ONDANSETRON HCL 4 MG/2ML IJ SOLN
INTRAMUSCULAR | Status: AC
  Filled 2021-06-08: qty 2

## 2021-06-08 MED ORDER — FENTANYL CITRATE (PF) 100 MCG/2ML IJ SOLN
INTRAMUSCULAR | Status: AC
  Filled 2021-06-08: qty 2

## 2021-06-08 MED ORDER — ROCURONIUM BROMIDE 100 MG/10ML IV SOLN
INTRAVENOUS | Status: DC | PRN
  Administered 2021-06-08: 60 mg via INTRAVENOUS

## 2021-06-08 MED ORDER — OXYCODONE HCL 5 MG/5ML PO SOLN: 5.0000 mg | Freq: Once | ORAL | Status: AC | PRN

## 2021-06-08 MED ORDER — OXYCODONE HCL 5 MG PO TABS
ORAL_TABLET | ORAL | Status: AC
  Filled 2021-06-08: qty 1

## 2021-06-08 MED ORDER — SUGAMMADEX SODIUM 500 MG/5ML IV SOLN
INTRAVENOUS | Status: DC | PRN
  Administered 2021-06-08: 600 mg via INTRAVENOUS

## 2021-06-08 MED ORDER — ACETAMINOPHEN 500 MG PO TABS: 1000.0000 mg | ORAL_TABLET | ORAL | Status: AC

## 2021-06-08 MED ORDER — PROMETHAZINE HCL 25 MG/ML IJ SOLN: 6.2500 mg | INTRAMUSCULAR | Status: DC | PRN

## 2021-06-08 MED ORDER — DOCUSATE SODIUM 100 MG PO CAPS: 100.0000 mg | ORAL_CAPSULE | Freq: Two times a day (BID) | ORAL | 0 refills | Status: AC | PRN

## 2021-06-08 MED ORDER — BUPIVACAINE-EPINEPHRINE (PF) 0.25% -1:200000 IJ SOLN
INTRAMUSCULAR | Status: AC
  Filled 2021-06-08: qty 30

## 2021-06-08 MED ORDER — ROCURONIUM BROMIDE 10 MG/ML (PF) SYRINGE
PREFILLED_SYRINGE | INTRAVENOUS | Status: AC
  Filled 2021-06-08: qty 10

## 2021-06-08 MED ORDER — ACETAMINOPHEN 500 MG PO TABS
ORAL_TABLET | ORAL | Status: AC
  Administered 2021-06-08: 1000 mg via ORAL
  Filled 2021-06-08: qty 2

## 2021-06-08 MED ORDER — LIDOCAINE HCL (PF) 2 % IJ SOLN
INTRAMUSCULAR | Status: AC
  Filled 2021-06-08: qty 5

## 2021-06-08 MED ORDER — CELECOXIB 200 MG PO CAPS: 200.0000 mg | ORAL_CAPSULE | ORAL | Status: AC

## 2021-06-08 MED ORDER — CHLORHEXIDINE GLUCONATE 0.12 % MT SOLN
OROMUCOSAL | Status: AC
  Administered 2021-06-08: 15 mL via OROMUCOSAL
  Filled 2021-06-08: qty 15

## 2021-06-08 MED ORDER — MIDAZOLAM HCL 2 MG/2ML IJ SOLN
INTRAMUSCULAR | Status: AC
  Filled 2021-06-08: qty 2

## 2021-06-08 MED ORDER — GABAPENTIN 300 MG PO CAPS
ORAL_CAPSULE | ORAL | Status: AC
  Administered 2021-06-08: 300 mg via ORAL
  Filled 2021-06-08: qty 1

## 2021-06-08 MED ORDER — BUPIVACAINE-EPINEPHRINE 0.25% -1:200000 IJ SOLN
INTRAMUSCULAR | Status: DC | PRN
  Administered 2021-06-08: 6.5 mL

## 2021-06-08 MED ORDER — CHLORHEXIDINE GLUCONATE CLOTH 2 % EX PADS: 6.0000 | MEDICATED_PAD | Freq: Once | CUTANEOUS | Status: DC

## 2021-06-08 MED ORDER — CELECOXIB 200 MG PO CAPS: 200.0000 mg | ORAL_CAPSULE | Freq: Once | ORAL | Status: DC

## 2021-06-08 MED ORDER — HYDROCODONE-ACETAMINOPHEN 5-325 MG PO TABS: 1.0000 | ORAL_TABLET | Freq: Four times a day (QID) | ORAL | 0 refills | Status: DC | PRN

## 2021-06-08 MED ORDER — LIDOCAINE HCL (CARDIAC) PF 100 MG/5ML IV SOSY
PREFILLED_SYRINGE | INTRAVENOUS | Status: DC | PRN
Start: 2021-06-08 — End: 2021-06-08
  Administered 2021-06-08: 100 mg via INTRAVENOUS

## 2021-06-08 MED ORDER — BUPIVACAINE HCL (PF) 0.25 % IJ SOLN
INTRAMUSCULAR | Status: AC
  Filled 2021-06-08: qty 30

## 2021-06-08 MED ORDER — DEXMEDETOMIDINE (PRECEDEX) IN NS 20 MCG/5ML (4 MCG/ML) IV SYRINGE
PREFILLED_SYRINGE | INTRAVENOUS | Status: DC | PRN
  Administered 2021-06-08: 16 ug via INTRAVENOUS
  Administered 2021-06-08: 8 ug via INTRAVENOUS
  Administered 2021-06-08: 12 ug via INTRAVENOUS

## 2021-06-08 MED ORDER — LIDOCAINE HCL 1 % IJ SOLN
INTRAMUSCULAR | Status: DC | PRN
  Administered 2021-06-08: 6.5 mL

## 2021-06-08 MED ORDER — HYDROMORPHONE HCL 1 MG/ML IJ SOLN
INTRAMUSCULAR | Status: AC
  Filled 2021-06-08: qty 1

## 2021-06-08 MED ORDER — HYDROCODONE-ACETAMINOPHEN 7.5-325 MG PO TABS: 1.0000 | ORAL_TABLET | Freq: Four times a day (QID) | ORAL | 0 refills | Status: DC | PRN

## 2021-06-08 MED ORDER — CEFAZOLIN SODIUM-DEXTROSE 2-4 GM/100ML-% IV SOLN
INTRAVENOUS | Status: AC
  Filled 2021-06-08: qty 100

## 2021-06-08 MED ORDER — FENTANYL CITRATE (PF) 100 MCG/2ML IJ SOLN
25.0000 ug | INTRAMUSCULAR | Status: DC | PRN
  Administered 2021-06-08: 25 ug via INTRAVENOUS

## 2021-06-08 MED ORDER — CELECOXIB 200 MG PO CAPS
ORAL_CAPSULE | ORAL | Status: AC
  Administered 2021-06-08: 200 mg via ORAL
  Filled 2021-06-08: qty 1

## 2021-06-08 MED ORDER — SUCCINYLCHOLINE CHLORIDE 200 MG/10ML IV SOSY
PREFILLED_SYRINGE | INTRAVENOUS | Status: AC
  Filled 2021-06-08: qty 10

## 2021-06-08 MED ORDER — LACTATED RINGERS IV SOLN: INTRAVENOUS | Status: DC

## 2021-06-08 MED ORDER — LIDOCAINE HCL (PF) 1 % IJ SOLN
INTRAMUSCULAR | Status: AC
  Filled 2021-06-08: qty 30

## 2021-06-08 MED ORDER — DEXAMETHASONE SODIUM PHOSPHATE 10 MG/ML IJ SOLN
INTRAMUSCULAR | Status: AC
  Filled 2021-06-08: qty 1

## 2021-06-08 MED ORDER — ONDANSETRON HCL 4 MG/2ML IJ SOLN
INTRAMUSCULAR | Status: DC | PRN
Start: 2021-06-08 — End: 2021-06-08
  Administered 2021-06-08: 4 mg via INTRAVENOUS

## 2021-06-08 SURGICAL SUPPLY — 35 items
APL PRP STRL LF DISP 70% ISPRP (MISCELLANEOUS)
BLADE SURG 15 STRL LF DISP TIS (BLADE) ×1 IMPLANT
BLADE SURG 15 STRL SS (BLADE) ×2
CHLORAPREP W/TINT 26 (MISCELLANEOUS) ×1 IMPLANT
DRAPE LAPAROTOMY 100X77 ABD (DRAPES) ×2 IMPLANT
DRSG GAUZE FLUFF 36X18 (GAUZE/BANDAGES/DRESSINGS) ×1 IMPLANT
ELECT REM PT RETURN 9FT ADLT (ELECTROSURGICAL) ×2
ELECTRODE REM PT RTRN 9FT ADLT (ELECTROSURGICAL) ×1 IMPLANT
GAUZE 4X4 16PLY ~~LOC~~+RFID DBL (SPONGE) ×2 IMPLANT
GAUZE PACKING 1/4 X5 YD (GAUZE/BANDAGES/DRESSINGS) ×1 IMPLANT
GAUZE PACKING IODOFORM 1/2 (PACKING) IMPLANT
GAUZE SPONGE 4X4 12PLY STRL (GAUZE/BANDAGES/DRESSINGS) ×3 IMPLANT
GLOVE SURG SYN 6.5 ES PF (GLOVE) ×4 IMPLANT
GLOVE SURG SYN 6.5 PF PI (GLOVE) ×2 IMPLANT
GLOVE SURG UNDER POLY LF SZ7 (GLOVE) ×2 IMPLANT
GOWN STRL REUS W/ TWL LRG LVL3 (GOWN DISPOSABLE) ×2 IMPLANT
GOWN STRL REUS W/TWL LRG LVL3 (GOWN DISPOSABLE) ×4
LABEL OR SOLS (LABEL) ×1 IMPLANT
MANIFOLD NEPTUNE II (INSTRUMENTS) ×2 IMPLANT
NEEDLE HYPO 22GX1.5 SAFETY (NEEDLE) ×2 IMPLANT
NS IRRIG 500ML POUR BTL (IV SOLUTION) ×2 IMPLANT
PACK BASIN MINOR ARMC (MISCELLANEOUS) ×2 IMPLANT
PAD ARMBOARD 7.5X6 YLW CONV (MISCELLANEOUS) ×1 IMPLANT
PANTS MESH DISP 2XL (UNDERPADS AND DIAPERS) IMPLANT
PANTS MESH DISPOSABLE 2XL (UNDERPADS AND DIAPERS) ×1
SOL PREP POV-IOD 4OZ 10% (MISCELLANEOUS) ×1 IMPLANT
SUT MNCRL 4-0 (SUTURE)
SUT MNCRL 4-0 27XMFL (SUTURE)
SUT VIC AB 2-0 CT2 27 (SUTURE) ×1 IMPLANT
SUT VIC AB 3-0 SH 27 (SUTURE)
SUT VIC AB 3-0 SH 27X BRD (SUTURE) ×1 IMPLANT
SUTURE MNCRL 4-0 27XMF (SUTURE) ×1 IMPLANT
SYR 10ML LL (SYRINGE) ×2 IMPLANT
TOWEL OR 17X26 4PK STRL BLUE (TOWEL DISPOSABLE) ×2 IMPLANT
WATER STERILE IRR 500ML POUR (IV SOLUTION) ×1 IMPLANT

## 2021-06-08 NOTE — Op Note (Signed)
Preoperative diagnosis: perirectal abscess Postoperative diagnosis: same  Procedure: Incision and drainage of perirectal abscess  Anesthesia: GETA  Surgeon: Lysle Pearl  Wound Classification: Contaminated  Indications: Patient is a 36 y.o. female  presented with above.  See H&P for further details.  Specimen: none  Complications: None  Estimated Blood Loss: 74mL  Findings:  1. Perirectal abscess 2. purulent secretions drained  3. Adequate hemostasis.  4. No evidence of anal fistula  Description of procedure: The patient was placed in the prone position and GETA anesthesia was induced. The area was prepped and draped in the usual sterile fashion. A timeout was completed verifying correct patient, procedure, site, positioning, and implant(s) and/or special equipment prior to beginning this procedure.  Local infused over planned incision site.  Inicision made over most indurated area in posterior off right midline at anal verge aand purulent secretions was drained. With a hemostat blunt dissection of septas performed to drain the abscess completely.  DRE noted no obvious fistula from rectal vault to area of abscess. The wound then irrigated, hemostasis confirmed and then packed with packing, dressed with 4x4s and secured with mesh undergarment. The patient tolerated the procedure well and was taken to the postanesthesia care unit in satisfactory condition.

## 2021-06-08 NOTE — Anesthesia Preprocedure Evaluation (Addendum)
Anesthesia Evaluation  Patient identified by MRN, date of birth, ID band Patient awake    Reviewed: Allergy & Precautions, NPO status , Patient's Chart, lab work & pertinent test results  Airway Mallampati: III  TM Distance: >3 FB Neck ROM: full    Dental no notable dental hx.    Pulmonary neg pulmonary ROS,    Pulmonary exam normal        Cardiovascular Exercise Tolerance: Good hypertension, Pt. on medications Normal cardiovascular exam     Neuro/Psych negative neurological ROS  negative psych ROS   GI/Hepatic negative GI ROS, (+)     substance abuse  marijuana use,   Endo/Other  diabetesMorbid obesity  Renal/GU      Musculoskeletal   Abdominal (+) + obese,   Peds  Hematology negative hematology ROS (+)   Anesthesia Other Findings Past Medical History: No date: Anemia No date: Hypertension No date: Vaginal Pap smear, abnormal  Past Surgical History: No date: CESAREAN SECTION     Reproductive/Obstetrics negative OB ROS                           Anesthesia Physical Anesthesia Plan  ASA: 3  Anesthesia Plan: General ETT   Post-op Pain Management: Tylenol PO (pre-op), Celebrex PO (pre-op) and Gabapentin PO (pre-op)   Induction: Intravenous  PONV Risk Score and Plan: Ondansetron, Dexamethasone, Midazolam and Treatment may vary due to age or medical condition  Airway Management Planned: Oral ETT  Additional Equipment:   Intra-op Plan:   Post-operative Plan: Extubation in OR  Informed Consent: I have reviewed the patients History and Physical, chart, labs and discussed the procedure including the risks, benefits and alternatives for the proposed anesthesia with the patient or authorized representative who has indicated his/her understanding and acceptance.     Dental Advisory Given  Plan Discussed with: Anesthesiologist, CRNA and Surgeon  Anesthesia Plan Comments:         Anesthesia Quick Evaluation

## 2021-06-08 NOTE — Anesthesia Procedure Notes (Signed)
Procedure Name: Intubation Date/Time: 06/08/2021 2:04 PM Performed by: Malva Cogan, CRNA Pre-anesthesia Checklist: Patient identified, Patient being monitored, Timeout performed, Emergency Drugs available and Suction available Patient Re-evaluated:Patient Re-evaluated prior to induction Oxygen Delivery Method: Circle system utilized Preoxygenation: Pre-oxygenation with 100% oxygen Induction Type: IV induction Ventilation: Mask ventilation without difficulty and Oral airway inserted - appropriate to patient size Laryngoscope Size: 3 and McGraph Grade View: Grade I Tube type: Oral Tube size: 6.5 mm Number of attempts: 1 Airway Equipment and Method: Stylet and Video-laryngoscopy Placement Confirmation: ETT inserted through vocal cords under direct vision, positive ETCO2 and breath sounds checked- equal and bilateral Secured at: 21 cm Tube secured with: Tape Dental Injury: Teeth and Oropharynx as per pre-operative assessment

## 2021-06-08 NOTE — Transfer of Care (Signed)
Immediate Anesthesia Transfer of Care Note  Patient: Sara Moore  Procedure(s) Performed: INCISION AND DRAINAGE ABSCESS (Anus)  Patient Location: PACU  Anesthesia Type:General  Level of Consciousness: awake, alert  and oriented  Airway & Oxygen Therapy: Patient Spontanous Breathing and Patient connected to nasal cannula oxygen  Post-op Assessment: Report given to RN and Post -op Vital signs reviewed and stable  Post vital signs: Reviewed and stable  Last Vitals:  Vitals Value Taken Time  BP 148/79 06/08/21 1457  Temp    Pulse 92 06/08/21 1500  Resp 18 06/08/21 1500  SpO2 99 % 06/08/21 1500  Vitals shown include unvalidated device data.  Last Pain:  Vitals:   06/08/21 1327  TempSrc: Temporal  PainSc: 3          Complications: No notable events documented.

## 2021-06-08 NOTE — Interval H&P Note (Signed)
History and Physical Interval Note:  06/08/2021 1:26 PM  Sara Moore  has presented today for surgery, with the diagnosis of K61.0 Perianal abscess.  The various methods of treatment have been discussed with the patient and family. After consideration of risks, benefits and other options for treatment, the patient has consented to  Procedure(s): INCISION AND DRAINAGE ABSCESS (N/A) as a surgical intervention.  The patient's history has been reviewed, patient examined, no change in status, stable for surgery.  I have reviewed the patient's chart and labs.  Questions were answered to the patient's satisfaction.     Louisa Favaro Tonna Boehringer

## 2021-06-08 NOTE — H&P (View-Only) (Signed)
Subjective: ° °CC: Perianal abscess [K61.0] ° °HPI: ° Sara Moore is a 36 y.o. female who was referred by Emergency Room for evaluation of above. First noted a few weeks ago.  Symptoms include: Pain is sharp, constant, worsening.  Exacerbated by nothing.  Alleviated by nothing.  Associated with nothing.   °  °Past Medical History: HTN, DM ° °Past Surgical History: includes c-section ° °Family History: reviewed and not relevant to CCs ° °Social History:  reports that she has been smoking cigarettes. She has never used smokeless tobacco. No history on file for alcohol use and drug use. ° °Current Medications: has a current medication list which includes the following prescription(s): amlodipine-benazepril, lisinopril-hydrochlorothiazide, norethindrone, and semaglutide. ° °Allergies:  °No Known Allergies ° °ROS:  °A 15 point review of systems was performed and pertinent positives and negatives noted in HPI °  °Objective: °  ° °BP (!) 141/95    Pulse (!) 119    Ht 175.3 cm (5' 9")    Wt (!) 146.1 kg (322 lb)    BMI 47.55 kg/m²  ° °Constitutional :  No distress, cooperative, alert °Lymphatics/Throat:  Supple with no lymphadenopathy °Respiratory:  Clear to auscultation bilaterally °Cardiovascular:  Regular rate and rhythm °Gastrointestinal: Soft, non-tender, non-distended, no organomegaly. °Musculoskeletal: Steady gait and movement °Skin: Cool and moist, no surgical scars °Psychiatric: Normal affect, non-agitated, not confused °Rectal: Chaperone present for exam.  TTP in right posterior aspect starting at anal verge, with slight swelling, possible induration extending toward anus, consistent with abscess noted on CT scan °  °  °LABS:  °n/a  ° °RADS: °CLINICAL DATA:  Pain in the anorectal region  ° °EXAM:  °CT PELVIS WITH CONTRAST  ° °TECHNIQUE:  °Multidetector CT imaging of the pelvis was performed using the  °standard protocol following the bolus administration of intravenous  °contrast.  ° °RADIATION DOSE  REDUCTION: This exam was performed according to the  °departmental dose-optimization program which includes automated  °exposure control, adjustment of the mA and/or kV according to  °patient size and/or use of iterative reconstruction technique.  ° °CONTRAST:  100mL OMNIPAQUE IOHEXOL 350 MG/ML SOLN  ° °COMPARISON:  None.  ° °FINDINGS:  °There is no free fluid in the pelvis. Bowel loops in the pelvis are  °unremarkable. There is slightly inhomogeneous attenuation in the  °uterus. There are no dominant adnexal masses. There is no  °significant wall thickening in the rectum. There is 3.3 x 1.5 cm  °loculated thick-walled fluid collection posterior to the anus. There  °is umbilical hernia containing fat. Bony structures are  °unremarkable.  ° °Musculoskeletal: Unremarkable.  ° °IMPRESSION:  °There is a 3.3 x 1.5 cm thick-walled loculated fluid collection  °posterior to the anus suggesting perianal abscess.  ° °There is no significant wall thickening in the rectum. There is no  °perirectal fluid collection in the pelvic cavity.  ° °Umbilical hernia containing fat is seen.  ° ° °Electronically Signed  °  By: Palani  Rathinasamy M.D.  °  On: 06/07/2021 17:51 ° ° °Assessment: ° °   °Perianal abscess [K61.0], recommend I&D due to mature abscess and persistent pain. ° °Plan: ° °  °1. Perianal abscess [K61.0]  °Alternatives include continued observation with abx.  Benefits include possible symptom relief. °Discussed the risk of surgery including recurrence, chronic pain, post-op infxn, poor cosmesis, poor/delayed wound healing, and possible re-operation to address said risks. The risks of general anesthetic, if used, includes MI, CVA, sudden death or even reaction to   anesthetic medications also discussed.  °Typical post-op recovery time of weeks of wound healing with possible activity restrictions were also discussed. ° °The patient verbalized understanding and all questions were answered to the patient's satisfaction. ° °2.  Patient has elected to proceed with surgical treatment. Procedure will be scheduled. Prone position ° °labs/images/medications/previous chart entries reviewed personally and relevant changes/updates noted above. ° ° °

## 2021-06-08 NOTE — H&P (Signed)
Subjective:  CC: Perianal abscess [K61.0]  HPI:  Sara Moore is a 35 y.o. female who was referred by Emergency Room for evaluation of above. First noted a few weeks ago.  Symptoms include: Pain is sharp, constant, worsening.  Exacerbated by nothing.  Alleviated by nothing.  Associated with nothing.     Past Medical History: HTN, DM  Past Surgical History: includes c-section  Family History: reviewed and not relevant to CCs  Social History:  reports that she has been smoking cigarettes. She has never used smokeless tobacco. No history on file for alcohol use and drug use.  Current Medications: has a current medication list which includes the following prescription(s): amlodipine-benazepril, lisinopril-hydrochlorothiazide, norethindrone, and semaglutide.  Allergies:  No Known Allergies  ROS:  A 15 point review of systems was performed and pertinent positives and negatives noted in HPI   Objective:    BP (!) 141/95    Pulse (!) 119    Ht 175.3 cm (5\' 9" )    Wt (!) 146.1 kg (322 lb)    BMI 47.55 kg/m   Constitutional :  No distress, cooperative, alert Lymphatics/Throat:  Supple with no lymphadenopathy Respiratory:  Clear to auscultation bilaterally Cardiovascular:  Regular rate and rhythm Gastrointestinal: Soft, non-tender, non-distended, no organomegaly. Musculoskeletal: Steady gait and movement Skin: Cool and moist, no surgical scars Psychiatric: Normal affect, non-agitated, not confused Rectal: Chaperone present for exam.  TTP in right posterior aspect starting at anal verge, with slight swelling, possible induration extending toward anus, consistent with abscess noted on CT scan     LABS:  n/a   RADS: CLINICAL DATA:  Pain in the anorectal region   EXAM:  CT PELVIS WITH CONTRAST   TECHNIQUE:  Multidetector CT imaging of the pelvis was performed using the  standard protocol following the bolus administration of intravenous  contrast.   RADIATION DOSE  REDUCTION: This exam was performed according to the  departmental dose-optimization program which includes automated  exposure control, adjustment of the mA and/or kV according to  patient size and/or use of iterative reconstruction technique.   CONTRAST:  142mL OMNIPAQUE IOHEXOL 350 MG/ML SOLN   COMPARISON:  None.   FINDINGS:  There is no free fluid in the pelvis. Bowel loops in the pelvis are  unremarkable. There is slightly inhomogeneous attenuation in the  uterus. There are no dominant adnexal masses. There is no  significant wall thickening in the rectum. There is 3.3 x 1.5 cm  loculated thick-walled fluid collection posterior to the anus. There  is umbilical hernia containing fat. Bony structures are  unremarkable.   Musculoskeletal: Unremarkable.   IMPRESSION:  There is a 3.3 x 1.5 cm thick-walled loculated fluid collection  posterior to the anus suggesting perianal abscess.   There is no significant wall thickening in the rectum. There is no  perirectal fluid collection in the pelvic cavity.   Umbilical hernia containing fat is seen.    Electronically Signed    By: Elmer Picker M.D.    On: 06/07/2021 17:51   Assessment:     Perianal abscess [K61.0], recommend I&D due to mature abscess and persistent pain.  Plan:    1. Perianal abscess [K61.0]  Alternatives include continued observation with abx.  Benefits include possible symptom relief. Discussed the risk of surgery including recurrence, chronic pain, post-op infxn, poor cosmesis, poor/delayed wound healing, and possible re-operation to address said risks. The risks of general anesthetic, if used, includes MI, CVA, sudden death or even reaction to  anesthetic medications also discussed.  Typical post-op recovery time of weeks of wound healing with possible activity restrictions were also discussed.  The patient verbalized understanding and all questions were answered to the patient's satisfaction.  2.  Patient has elected to proceed with surgical treatment. Procedure will be scheduled. Prone position  labs/images/medications/previous chart entries reviewed personally and relevant changes/updates noted above.

## 2021-06-08 NOTE — Discharge Instructions (Addendum)
Removal, Care After This sheet gives you information about how to care for yourself after your procedure. Your health care provider may also give you more specific instructions. If you have problems or questions, contact your health care provider. What can I expect after the procedure? After the procedure, it is common to have: Soreness. Bruising. Itching. Follow these instructions at home: site care Follow instructions from your health care provider about how to take care of your site. Make sure you: Keep dressing and packing intact as much as possible.  No shower or bathing until followup appointment If dressing and/or packing falls out.  Keep area covered until next visit.  No need to repack Expect discharge/minor bleeding from area.  Apply pressure for 64min at a time if there is bleeding.  Check your site every day for signs of infection. Check for: Redness, swelling, or pain. Fluid or blood. Warmth. Pus or a bad smell.  General instructions Rest and then return to your normal activities as told by your health care provider.  tylenol and advil as needed for discomfort.  Please alternate between the two every four hours as needed for pain.    Use narcotics, if prescribed, only when tylenol and motrin is not enough to control pain.  325-650mg  every 8hrs to max of 3000mg /24hrs (including the 325mg  in every norco dose) for the tylenol.    Advil up to 800mg  per dose every 8hrs as needed for pain.   Keep all follow-up visits as told by your health care provider. This is important. Contact a health care provider if: You have redness, swelling, or pain around your site. You have fluid or blood coming from your site. Your site feels warm to the touch. You have pus or a bad smell coming from your site. You have a fever. Your sutures, skin glue, or adhesive strips loosen or come off sooner than expected. Get help right away if: You have bleeding that does not stop with pressure or a  dressing. Summary After the procedure, it is common to have some soreness, bruising, and itching at the site. Follow instructions from your health care provider about how to take care of your site. Check your site every day for signs of infection. Contact a health care provider if you have redness, swelling, or pain around your site, or your site feels warm to the touch. Keep all follow-up visits as told by your health care provider. This is important. This information is not intended to replace advice given to you by your health care provider. Make sure you discuss any questions you have with your health care provider. Document Released: 05/15/2015 Document Revised: 10/16/2017 Document Reviewed: 10/16/2017 Elsevier Interactive Patient Education  2019 [place]   The drugs that you were given will stay in your system until tomorrow so for the next 24 hours you should not:  Drive an automobile Make any legal decisions Drink any alcoholic beverage   You may resume regular meals tomorrow.  Today it is better to start with liquids and gradually work up to solid foods.  You may eat anything you prefer, but it is better to start with liquids, then soup and crackers, and gradually work up to solid foods.   Please notify your doctor immediately if you have any unusual bleeding, trouble breathing, redness and pain at the surgery site, drainage, fever, or pain not relieved by medication.    Additional Instructions:  Please contact your physician with any problems or Same Day Surgery at [phone], Monday through Friday 6 am to 4 pm, or [place] at [facility] number at [phone].

## 2021-06-09 ENCOUNTER — Encounter: Payer: Self-pay | Admitting: Surgery

## 2021-06-09 NOTE — Anesthesia Postprocedure Evaluation (Signed)
Anesthesia Post Note  Patient: Sara Moore  Procedure(s) Performed: INCISION AND DRAINAGE ABSCESS (Anus)  Patient location during evaluation: PACU Anesthesia Type: General Level of consciousness: awake and alert Pain management: pain level controlled Vital Signs Assessment: post-procedure vital signs reviewed and stable Respiratory status: spontaneous breathing, nonlabored ventilation and respiratory function stable Cardiovascular status: blood pressure returned to baseline and stable Postop Assessment: no apparent nausea or vomiting Anesthetic complications: no   No notable events documented.   Last Vitals:  Vitals:   06/08/21 1600 06/08/21 1641  BP:  (!) 148/88  Pulse:  91  Resp:  20  Temp: (!) 36.2 C (!) 36.3 C  SpO2: 97% 97%    Last Pain:  Vitals:   06/09/21 0945  TempSrc:   PainSc: 4                  Foye Deer

## 2021-11-08 ENCOUNTER — Ambulatory Visit: Payer: Self-pay | Admitting: Diagnostic Neuroimaging

## 2021-11-24 ENCOUNTER — Encounter: Payer: Self-pay | Admitting: *Deleted

## 2021-11-29 ENCOUNTER — Encounter: Payer: Self-pay | Admitting: Diagnostic Neuroimaging

## 2021-11-29 ENCOUNTER — Ambulatory Visit (INDEPENDENT_AMBULATORY_CARE_PROVIDER_SITE_OTHER): Payer: Medicaid Other | Admitting: Diagnostic Neuroimaging

## 2021-11-29 VITALS — BP 136/90 | HR 88 | Ht 69.0 in | Wt 305.2 lb

## 2021-11-29 DIAGNOSIS — M25521 Pain in right elbow: Secondary | ICD-10-CM

## 2021-11-29 NOTE — Progress Notes (Signed)
[facility]  PATIENT: Sara Moore DOB: December 15, 1985  REFERRING CLINICIAN: Renaye Rakers, MD HISTORY FROM: patient REASON FOR VISIT: new consult   HISTORICAL  CHIEF COMPLAINT:  Chief Complaint  Patient presents with   Right arm pain    Rm 7 New Pt  "pain in right arm near elbow since surgery in Feb"     HISTORY OF PRESENT ILLNESS:   36 year old female here for evaluation of right arm pain.  Had surgical procedure in February 2023.  Had IV placed in right arm before anesthesia.  When she woke up she felt pain in her right arm near the IV site.  Symptoms continued over time.  Initially symptoms were consistent, now intermittent.  She tends to find certain positions which aggravate such as putting her arm behind her back.  She got to physical therapy with some improvement in symptoms.    REVIEW OF SYSTEMS: Full 14 system review of systems performed and negative with exception of: as per HPI.  ALLERGIES: No Known Allergies  HOME MEDICATIONS: Outpatient Medications Prior to Visit  Medication Sig Dispense Refill   atorvastatin (LIPITOR) 20 MG tablet Take 20 mg by mouth at bedtime.     ibuprofen (ADVIL) 800 MG tablet Take 1 tablet (800 mg total) by mouth every 8 (eight) hours as needed for mild pain or moderate pain. 30 tablet 0   lisinopril (ZESTRIL) 20 MG tablet Take 20 mg by mouth daily.     norethindrone (AYGESTIN) 5 MG tablet Take 1 tablet (5 mg total) by mouth 2 (two) times daily. 120 tablet 7   Semaglutide, 1 MG/DOSE, (OZEMPIC, 1 MG/DOSE,) 2 MG/1.5ML SOPN Inject 1 mg into the skin once a week.     amLODipine-benazepril (LOTREL) 5-10 MG capsule Take 1 capsule by mouth daily. (Patient not taking: Reported on 11/29/2021)     HYDROcodone-acetaminophen (NORCO) 5-325 MG tablet Take 1 tablet by mouth every 6 (six) hours as needed for moderate pain. (Patient not taking: Reported on 11/29/2021) 10 tablet 0   HYDROcodone-acetaminophen (NORCO) 5-325 MG tablet Take 1  tablet by mouth every 6 (six) hours as needed for up to 6 doses for moderate pain. 6 tablet 0   HYDROcodone-acetaminophen (NORCO) 7.5-325 MG tablet Take 1 tablet by mouth every 6 (six) hours as needed for moderate pain. 6 tablet 0   lisinopril-hydrochlorothiazide (PRINZIDE,ZESTORETIC) 20-12.5 MG tablet Take 1 tablet by mouth daily.     metroNIDAZOLE (FLAGYL) 500 MG tablet TAKE 1 TABLET BY MOUTH TWICE A DAY 14 tablet 0   No facility-administered medications prior to visit.    PAST MEDICAL HISTORY: Past Medical History:  Diagnosis Date   Anemia    Diabetes mellitus without complication (HCC)    prediabetes   Hypertension    Vaginal Pap smear, abnormal     PAST SURGICAL HISTORY: Past Surgical History:  Procedure Laterality Date   CESAREAN SECTION     INCISION AND DRAINAGE ABSCESS N/A 06/08/2021   Procedure: INCISION AND DRAINAGE ABSCESS;  Surgeon: Sung Amabile, DO;  Location: [facility] ORS;  Service: General;  Laterality: N/A;    FAMILY HISTORY: Family History  Problem Relation Age of Onset   Hypertension Mother    Diabetes Father    Hypertension Father     SOCIAL HISTORY: Social History   Socioeconomic History   Marital status: Legally Separated    Spouse name: Not on file   Number of children: 1   Years of education: Not on file   Highest education  level: High school graduate  Occupational History   Not on file  Tobacco Use   Smoking status: Never   Smokeless tobacco: Never  Substance and Sexual Activity   Alcohol use: No    Alcohol/week: 0.0 standard drinks of alcohol   Drug use: Yes    Frequency: 7.0 times per week    Types: Marijuana    Comment: 11/29/21 daily   Sexual activity: Yes    Birth control/protection: Condom, Pill  Other Topics Concern   Not on file  Social History Narrative   Lives with child   Social Determinants of Health   Financial Resource Strain: Not on file  Food Insecurity: Not on file  Transportation Needs: Not on file  Physical  Activity: Not on file  Stress: Not on file  Social Connections: Not on file  Intimate Partner Violence: Not on file     PHYSICAL EXAM  GENERAL EXAM/CONSTITUTIONAL: Vitals:  Vitals:   11/29/21 1453  BP: 136/90  Pulse: 88  Weight: (!) 305 lb 3.2 oz (138.4 kg)  Height: 5\' 9"  (1.753 m)   Body mass index is 45.07 kg/m. Wt Readings from Last 3 Encounters:  11/29/21 (!) 305 lb 3.2 oz (138.4 kg)  06/08/21 (!) 322 lb (146.1 kg)  06/07/21 (!) 322 lb (146.1 kg)   Patient is in no distress; well developed, nourished and groomed; neck is supple  CARDIOVASCULAR: Examination of carotid arteries is normal; no carotid bruits Regular rate and rhythm, no murmurs Examination of peripheral vascular system by observation and palpation is normal  EYES: Ophthalmoscopic exam of optic discs and posterior segments is normal; no papilledema or hemorrhages No results found.  MUSCULOSKELETAL: Gait, strength, tone, movements noted in Neurologic exam below  NEUROLOGIC: MENTAL STATUS:      No data to display         awake, alert, oriented to person, place and time recent and remote memory intact normal attention and concentration language fluent, comprehension intact, naming intact fund of knowledge appropriate  CRANIAL NERVE:  2nd - no papilledema on fundoscopic exam 2nd, 3rd, 4th, 6th - pupils equal and reactive to light, visual fields full to confrontation, extraocular muscles intact, no nystagmus 5th - facial sensation symmetric 7th - facial strength symmetric 8th - hearing intact 9th - palate elevates symmetrically, uvula midline 11th - shoulder shrug symmetric 12th - tongue protrusion midline  MOTOR:  normal bulk and tone, full strength in the BUE, BLE  SENSORY:  normal and symmetric to light touch, temperature, vibration  COORDINATION:  finger-nose-finger, fine finger movements normal  REFLEXES:  deep tendon reflexes TRACE and symmetric  GAIT/STATION:  narrow based  gait     DIAGNOSTIC DATA (LABS, IMAGING, TESTING) - I reviewed patient records, labs, notes, testing and imaging myself where available.  Lab Results  Component Value Date   WBC 12.0 (H) 06/07/2021   HGB 14.2 06/07/2021   HCT 43.7 06/07/2021   MCV 89.0 06/07/2021   PLT 281 06/07/2021      Component Value Date/Time   NA 137 06/07/2021 1653   NA 143 08/16/2016 0849   K 3.4 (L) 06/07/2021 1653   CL 103 06/07/2021 1653   CO2 26 06/07/2021 1653   GLUCOSE 96 06/07/2021 1653   BUN 13 06/07/2021 1653   BUN 14 08/16/2016 0849   CREATININE 0.85 06/07/2021 1653   CREATININE 0.77 03/04/2013 1505   CALCIUM 8.9 06/07/2021 1653   PROT 7.3 08/16/2016 0849   ALBUMIN 4.1 08/16/2016 0849   AST  13 08/16/2016 0849   ALT 12 08/16/2016 0849   ALKPHOS 33 (L) 08/16/2016 0849   BILITOT <0.2 08/16/2016 0849   GFRNONAA >60 06/07/2021 1653   GFRAA 129 08/16/2016 0849   Lab Results  Component Value Date   CHOL 176 08/16/2016   HDL 41 08/16/2016   LDLCALC 122 (H) 08/16/2016   TRIG 64 08/16/2016   CHOLHDL 4.3 08/16/2016   Lab Results  Component Value Date   HGBA1C 5.7 (H) 08/16/2016   No results found for: "VITAMINB12" Lab Results  Component Value Date   TSH 0.716 08/16/2016       ASSESSMENT AND PLAN  36 y.o. year old female here with:  Dx:  1. Right elbow pain     PLAN:  INTERMITTENT RIGHT LATERAL ELBOW / BRACHIORADIALIS PAIN (post-IV placement) - no major weakness or sensory deficit - intermittent pain and tenderness with certain movements - slight improvement over time; continue PT exercises - consider hand surgery consult, ultrasound or MRI elbow for further testing  Return for return to PCP.  I spent 25 minutes of face-to-face and non-face-to-face time with patient.  This included previsit chart review, lab review, study review, order entry, electronic health record documentation, patient education.      Suanne Marker, MD 11/29/2021, 5:54 PM Certified in  Neurology, Neurophysiology and Neuroimaging  [facility] [address] [place], [place] [address] [phone]

## 2022-01-25 ENCOUNTER — Other Ambulatory Visit (HOSPITAL_BASED_OUTPATIENT_CLINIC_OR_DEPARTMENT_OTHER): Payer: Self-pay

## 2022-01-27 ENCOUNTER — Other Ambulatory Visit (HOSPITAL_BASED_OUTPATIENT_CLINIC_OR_DEPARTMENT_OTHER): Payer: Self-pay

## 2022-01-27 MED ORDER — OZEMPIC (1 MG/DOSE) 4 MG/3ML ~~LOC~~ SOPN
1.0000 mg | PEN_INJECTOR | SUBCUTANEOUS | 4 refills | Status: AC
  Filled 2022-01-27: qty 3, 28d supply, fill #0

## 2022-02-23 ENCOUNTER — Other Ambulatory Visit (HOSPITAL_BASED_OUTPATIENT_CLINIC_OR_DEPARTMENT_OTHER): Payer: Self-pay

## 2022-02-23 MED ORDER — OZEMPIC (1 MG/DOSE) 4 MG/3ML ~~LOC~~ SOPN
1.0000 mg | PEN_INJECTOR | SUBCUTANEOUS | 4 refills | Status: AC
  Filled 2022-02-23: qty 3, 28d supply, fill #0
  Filled 2022-03-28: qty 3, 28d supply, fill #1
  Filled 2022-04-19: qty 3, 28d supply, fill #2

## 2022-02-24 ENCOUNTER — Other Ambulatory Visit (HOSPITAL_BASED_OUTPATIENT_CLINIC_OR_DEPARTMENT_OTHER): Payer: Self-pay

## 2022-03-28 ENCOUNTER — Other Ambulatory Visit (HOSPITAL_BASED_OUTPATIENT_CLINIC_OR_DEPARTMENT_OTHER): Payer: Self-pay

## 2022-04-19 ENCOUNTER — Other Ambulatory Visit (HOSPITAL_BASED_OUTPATIENT_CLINIC_OR_DEPARTMENT_OTHER): Payer: Self-pay

## 2022-05-20 ENCOUNTER — Other Ambulatory Visit: Payer: Self-pay | Admitting: Obstetrics and Gynecology

## 2022-05-20 DIAGNOSIS — Z8742 Personal history of other diseases of the female genital tract: Secondary | ICD-10-CM

## 2022-05-20 DIAGNOSIS — Z01419 Encounter for gynecological examination (general) (routine) without abnormal findings: Secondary | ICD-10-CM

## 2022-05-26 ENCOUNTER — Other Ambulatory Visit (HOSPITAL_BASED_OUTPATIENT_CLINIC_OR_DEPARTMENT_OTHER): Payer: Self-pay

## 2022-05-26 MED ORDER — OZEMPIC (2 MG/DOSE) 8 MG/3ML ~~LOC~~ SOPN
2.0000 mg | PEN_INJECTOR | SUBCUTANEOUS | 3 refills | Status: DC
  Filled 2022-05-26 – 2022-06-10 (×2): qty 3, 28d supply, fill #0
  Filled 2022-07-04: qty 3, 28d supply, fill #1
  Filled 2022-08-01: qty 3, 28d supply, fill #2
  Filled 2022-09-09 (×2): qty 3, 28d supply, fill #3

## 2022-05-30 ENCOUNTER — Other Ambulatory Visit: Payer: Self-pay | Admitting: *Deleted

## 2022-05-30 DIAGNOSIS — Z8742 Personal history of other diseases of the female genital tract: Secondary | ICD-10-CM

## 2022-05-30 DIAGNOSIS — Z01419 Encounter for gynecological examination (general) (routine) without abnormal findings: Secondary | ICD-10-CM

## 2022-05-30 MED ORDER — NORETHINDRONE ACETATE 5 MG PO TABS: 5.0000 mg | ORAL_TABLET | Freq: Two times a day (BID) | ORAL | 1 refills | Status: DC

## 2022-06-02 ENCOUNTER — Other Ambulatory Visit (HOSPITAL_COMMUNITY)
Admission: RE | Admit: 2022-06-02 | Discharge: 2022-06-02 | Disposition: A | Discharge status: Home / Self Care | Payer: Medicaid Other | Source: Ambulatory Visit | Attending: Obstetrics and Gynecology | Admitting: Obstetrics and Gynecology

## 2022-06-02 ENCOUNTER — Ambulatory Visit (INDEPENDENT_AMBULATORY_CARE_PROVIDER_SITE_OTHER): Payer: Medicaid Other | Admitting: Obstetrics and Gynecology

## 2022-06-02 ENCOUNTER — Encounter: Payer: Self-pay | Admitting: Obstetrics and Gynecology

## 2022-06-02 ENCOUNTER — Ambulatory Visit: Payer: Medicaid Other | Admitting: Obstetrics and Gynecology

## 2022-06-02 VITALS — BP 149/91 | HR 82 | Ht 69.0 in | Wt 295.4 lb

## 2022-06-02 DIAGNOSIS — Z01419 Encounter for gynecological examination (general) (routine) without abnormal findings: Secondary | ICD-10-CM

## 2022-06-02 DIAGNOSIS — Z8742 Personal history of other diseases of the female genital tract: Secondary | ICD-10-CM

## 2022-06-02 DIAGNOSIS — Z09 Encounter for follow-up examination after completed treatment for conditions other than malignant neoplasm: Secondary | ICD-10-CM

## 2022-06-02 DIAGNOSIS — Z124 Encounter for screening for malignant neoplasm of cervix: Secondary | ICD-10-CM

## 2022-06-02 MED ORDER — NORETHINDRONE ACETATE 5 MG PO TABS: 5.0000 mg | ORAL_TABLET | Freq: Every day | ORAL | 6 refills | Status: DC

## 2022-06-02 NOTE — Progress Notes (Signed)
CC: Annual visit  Pt stating that she did have surgery to remove an abscess in the rectal area and is having some bleeding randomly.  Last Pap: 2018  Abnormal pap: years ago per pt  STI testing: yes

## 2022-06-02 NOTE — Progress Notes (Signed)
Obstetrics and Gynecology Annual Patient Evaluation  Appointment Date: 06/02/2022  OBGYN Clinic: [facility]   Primary Care Provider: Criss Rosales, Clinic  Chief Complaint:  Chief Complaint  Patient presents with   annual visit    History of Present Illness: Sara Moore is a 37 y.o. African-American G2P1011 (No LMP recorded. (Menstrual status: Oral contraceptives).), seen for the above chief complaint. Her past medical history is significant for BMI 50s, HTN, h/o AUB, adenomyosis on u/s, h/o c-section x 1.  Patient on Aygestin 5mg  po qday and doing well with it; she is still amenorrheic on it.   Only issue is sometimes she'll have random rectal bleeding/spotting, such as when walking around. Issue started after her I&D of a peri-rectal abscess last year. She denies any constipation.   Review of Systems: Pertinent items noted in HPI and remainder of comprehensive ROS otherwise negative.   Patient Active Problem List   Diagnosis Date Noted   Adenomyosis 09/12/2019   History of abnormal uterine bleeding 09/12/2019   BMI 40.0-44.9, adult ([place]) 05/10/2018   Abnormal uterine bleeding (AUB) 12/10/2015   Accelerated hypertension 03/04/2013   Dyslipidemia 03/04/2013   Morbid obesity ([place]) 03/04/2013   Past Medical History:  Past Medical History:  Diagnosis Date   Anemia    Diabetes mellitus without complication ([place])    prediabetes   Hypertension    Vaginal Pap smear, abnormal    Past Surgical History:  Past Surgical History:  Procedure Laterality Date   CESAREAN SECTION     INCISION AND DRAINAGE ABSCESS N/A 06/08/2021   Procedure: INCISION AND DRAINAGE ABSCESS;  Surgeon: Benjamine Sprague, DO;  Location: [facility] ORS;  Service: General;  Laterality: N/A;    Past Obstetrical History:  OB History  Gravida Para Term Preterm AB Living  2 1 1  0 1 1  SAB IAB Ectopic Multiple Live Births  0 1 0 0      # Outcome Date GA Lbr Len/2nd Weight Sex Delivery Anes  PTL Lv  2 Term 10/03/08 [redacted]w[redacted]d   F CS-Unspec        Birth Comments: Pre-eclampsia   1 IAB             Past Gynecological History: As per HPI. History of Pap Smear(s): Yes.   Last pap 2018, which was negative.  She is currently using oral progesterone-only contraceptive for contraception.   Social History:  Social History   Socioeconomic History   Marital status: Legally Separated    Spouse name: Not on file   Number of children: 1   Years of education: Not on file   Highest education level: High school graduate  Occupational History   Not on file  Tobacco Use   Smoking status: Never   Smokeless tobacco: Never  Substance and Sexual Activity   Alcohol use: No    Alcohol/week: 0.0 standard drinks of alcohol   Drug use: Yes    Frequency: 7.0 times per week    Types: Marijuana    Comment: 11/29/21 daily   Sexual activity: Yes    Birth control/protection: Condom  Other Topics Concern   Not on file  Social History Narrative   Lives with child   Social Determinants of Health   Financial Resource Strain: Not on file  Food Insecurity: Not on file  Transportation Needs: Not on file  Physical Activity: Not on file  Stress: Not on file  Social Connections: Not on file  Intimate Partner Violence: Not on file  Family History:  Family History  Problem Relation Age of Onset   Hypertension Mother    Diabetes Father    Hypertension Father    She denies any female cancers  Medications Sara Moore had no medications administered during this visit. Current Outpatient Medications  Medication Sig Dispense Refill   atorvastatin (LIPITOR) 20 MG tablet Take 20 mg by mouth at bedtime.     ibuprofen (ADVIL) 800 MG tablet Take 1 tablet (800 mg total) by mouth every 8 (eight) hours as needed for mild pain or moderate pain. 30 tablet 0   lisinopril (ZESTRIL) 20 MG tablet Take 20 mg by mouth daily.     Semaglutide, 1 MG/DOSE, (OZEMPIC, 1 MG/DOSE,) 2 MG/1.5ML SOPN Inject 1 mg  into the skin once a week.     amLODipine-benazepril (LOTREL) 5-10 MG capsule Take 1 capsule by mouth daily. (Patient not taking: Reported on 11/29/2021)     HYDROcodone-acetaminophen (NORCO) 5-325 MG tablet Take 1 tablet by mouth every 6 (six) hours as needed for moderate pain. (Patient not taking: Reported on 11/29/2021) 10 tablet 0   norethindrone (AYGESTIN) 5 MG tablet Take 1 tablet (5 mg total) by mouth daily. 60 tablet 6   Semaglutide, 1 MG/DOSE, (OZEMPIC, 1 MG/DOSE,) 4 MG/3ML SOPN Inject 1 mg into the skin once a week. (Patient not taking: Reported on 06/02/2022) 3 mL 4   Semaglutide, 1 MG/DOSE, (OZEMPIC, 1 MG/DOSE,) 4 MG/3ML SOPN Inject 1 mg into the skin once a week. (Patient not taking: Reported on 06/02/2022) 3 mL 4   Semaglutide, 2 MG/DOSE, (OZEMPIC, 2 MG/DOSE,) 8 MG/3ML SOPN Inject 2 mg into the skin once a week. (Patient not taking: Reported on 06/02/2022) 3 mL 3   No current facility-administered medications for this visit.    Allergies Patient has no known allergies.   Physical Exam:  BP (!) 149/91   Pulse 82   Ht 5\' 9"  (1.753 m)   Wt 295 lb 6.4 oz (134 kg)   BMI 43.62 kg/m  Body mass index is 43.62 kg/m.  General appearance: Well nourished, well developed female in no acute distress.  Neck:  Supple, normal appearance, and no thyromegaly  Cardiovascular: normal s1 and s2.  No murmurs, rubs or gallops. Respiratory:  Clear to auscultation bilateral. Normal respiratory effort Abdomen: positive bowel sounds and no masses, hernias; diffusely non tender to palpation, non distended Breasts: breasts appear normal, no suspicious masses, no skin or nipple changes or axillary nodes; normal palpation. Neuro/Psych:  Normal mood and affect.  Skin:  Warm and dry.  Lymphatic:  No inguinal lymphadenopathy.   Pelvic exam: is limited by body habitus EGBUS: within normal limits Vagina: within normal limits and with no blood or discharge in the vault Cervix: normal appearing cervix without  tenderness, discharge or lesions. Uterus:  nonenlarged and non tender Adnexa:  normal adnexa and no mass, fullness, tenderness Rectovaginal: deferred  Laboratory: none  Radiology: none  Assessment: patient doing well  Plan:  1. Well woman exam with routine gynecological exam Routine care. - Cytology - PAP( [place]) - norethindrone (AYGESTIN) 5 MG tablet; Take 1 tablet (5 mg total) by mouth daily.  Dispense: 60 tablet; Refill: 6  2. Cervical cancer screening - Cytology - PAP( [facility])  3. History of abnormal uterine bleeding Continue with qday aygestin - norethindrone (AYGESTIN) 5 MG tablet; Take 1 tablet (5 mg total) by mouth daily.  Dispense: 60 tablet; Refill: 6  4. S/P peri-rectal abscess repair, follow-up exam Normal  exam external. Will CC General Surgery and I also advised her to contact them too.    RTC 1 year and PRN  Durene Romans MD Attending Center for [facility])

## 2022-06-04 ENCOUNTER — Other Ambulatory Visit (HOSPITAL_BASED_OUTPATIENT_CLINIC_OR_DEPARTMENT_OTHER): Payer: Self-pay

## 2022-06-07 LAB — CYTOLOGY - PAP
Chlamydia: NEGATIVE
Comment: NEGATIVE
Comment: NEGATIVE
Comment: NEGATIVE
Comment: NEGATIVE
Comment: NEGATIVE
Comment: NORMAL
Diagnosis: HIGH — AB
HPV 16: NEGATIVE
HPV 18 / 45: NEGATIVE
High risk HPV: POSITIVE — AB
Neisseria Gonorrhea: NEGATIVE
Trichomonas: NEGATIVE

## 2022-06-08 ENCOUNTER — Encounter: Payer: Self-pay | Admitting: Obstetrics and Gynecology

## 2022-06-08 ENCOUNTER — Telehealth: Payer: Self-pay | Admitting: *Deleted

## 2022-06-08 DIAGNOSIS — R87613 High grade squamous intraepithelial lesion on cytologic smear of cervix (HGSIL): Secondary | ICD-10-CM | POA: Insufficient documentation

## 2022-06-08 NOTE — Telephone Encounter (Signed)
-----   Message from Aletha Halim, MD sent at 06/08/2022  8:33 AM EST ----- Please let her know that her pap smear came back abnormal and needs a colpo. Please schedule her with a colpo with me sometime in the next month. thanks

## 2022-06-08 NOTE — Telephone Encounter (Signed)
Called pt to go over pap results and needing a colpo, pt understands and colpo scheduled on 2/29 with Dr Ilda Basset

## 2022-06-10 ENCOUNTER — Other Ambulatory Visit (HOSPITAL_BASED_OUTPATIENT_CLINIC_OR_DEPARTMENT_OTHER): Payer: Self-pay

## 2022-06-30 ENCOUNTER — Other Ambulatory Visit (HOSPITAL_COMMUNITY)
Admission: RE | Admit: 2022-06-30 | Discharge: 2022-06-30 | Disposition: A | Discharge status: Home / Self Care | Payer: Medicaid Other | Source: Ambulatory Visit | Attending: Obstetrics and Gynecology | Admitting: Obstetrics and Gynecology

## 2022-06-30 ENCOUNTER — Ambulatory Visit (INDEPENDENT_AMBULATORY_CARE_PROVIDER_SITE_OTHER): Payer: Medicaid Other | Admitting: Obstetrics and Gynecology

## 2022-06-30 ENCOUNTER — Encounter: Payer: Self-pay | Admitting: Obstetrics and Gynecology

## 2022-06-30 VITALS — BP 128/85 | HR 94 | Wt 293.0 lb

## 2022-06-30 DIAGNOSIS — N898 Other specified noninflammatory disorders of vagina: Secondary | ICD-10-CM

## 2022-06-30 DIAGNOSIS — R87613 High grade squamous intraepithelial lesion on cytologic smear of cervix (HGSIL): Secondary | ICD-10-CM | POA: Insufficient documentation

## 2022-06-30 DIAGNOSIS — R87810 Cervical high risk human papillomavirus (HPV) DNA test positive: Secondary | ICD-10-CM

## 2022-06-30 HISTORY — PX: COLPOSCOPY W/ BIOPSY / CURETTAGE: SUR283

## 2022-06-30 NOTE — Procedures (Signed)
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  The patient was placed in the dorsal lithotomy position. A Graves was speculum inserted in the vagina, and the cervix was visualized.  Acetic acid staining was done and the cervix was viewed with green filter; lugol's staining with green filter was also done.  Biopsy from all four quadrants taken and then single toothed tenaculum applied and endocervical curettage in all four quadrants done. 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   Condition: Stable  Complications: None  Plan: The patient was advised to call for any fever or for prolonged or severe pain or bleeding. She was advised to use OTC analgesics as needed for mild to moderate pain. She was advised to do pelvic rest x 1 week  Aletha Halim, Brooke Bonito MD Attending Center for [employer] [facility])

## 2022-06-30 NOTE — Progress Notes (Signed)
RGYN here for colpo.   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

## 2022-07-01 LAB — CERVICOVAGINAL ANCILLARY ONLY
Bacterial Vaginitis (gardnerella): NEGATIVE
Candida Glabrata: NEGATIVE
Candida Vaginitis: NEGATIVE
Chlamydia: NEGATIVE
Comment: NEGATIVE
Comment: NEGATIVE
Comment: NEGATIVE
Comment: NEGATIVE
Comment: NEGATIVE
Comment: NORMAL
Neisseria Gonorrhea: NEGATIVE
Trichomonas: NEGATIVE

## 2022-07-01 LAB — SURGICAL PATHOLOGY

## 2022-07-04 ENCOUNTER — Telehealth: Payer: Self-pay | Admitting: *Deleted

## 2022-07-04 ENCOUNTER — Other Ambulatory Visit (HOSPITAL_BASED_OUTPATIENT_CLINIC_OR_DEPARTMENT_OTHER): Payer: Self-pay

## 2022-07-04 NOTE — Telephone Encounter (Signed)
-----   Message from Aletha Halim, MD sent at 07/03/2022  6:47 PM EST ----- Please contact [facility] lab to see if Sara jars were mis-labelled by [place]. I took four biopsies from Sara cervix and then did an ECC so they may be able to tell which jar is which based on knowing that  Also, please call Sara Moore and tell her I recommend she have an in office LEEP procedure sometime in Sara next month and set that up with me.

## 2022-07-04 NOTE — Telephone Encounter (Signed)
Called pt to go over Colpo results and the need for a LEEP. Pt scheduled on 3/28 with Dr Ilda Basset.

## 2022-07-13 ENCOUNTER — Other Ambulatory Visit: Payer: Self-pay

## 2022-07-13 ENCOUNTER — Emergency Department (HOSPITAL_COMMUNITY)
Admission: EM | Admit: 2022-07-13 | Discharge: 2022-07-13 | Discharge status: Against Medical Advice | Payer: Medicaid Other | Attending: Emergency Medicine | Admitting: Emergency Medicine

## 2022-07-13 DIAGNOSIS — Z5321 Procedure and treatment not carried out due to patient leaving prior to being seen by health care provider: Secondary | ICD-10-CM | POA: Insufficient documentation

## 2022-07-13 DIAGNOSIS — M545 Low back pain, unspecified: Secondary | ICD-10-CM | POA: Insufficient documentation

## 2022-07-13 DIAGNOSIS — R1031 Right lower quadrant pain: Secondary | ICD-10-CM | POA: Insufficient documentation

## 2022-07-13 DIAGNOSIS — R112 Nausea with vomiting, unspecified: Secondary | ICD-10-CM | POA: Insufficient documentation

## 2022-07-13 LAB — CBC WITH DIFFERENTIAL/PLATELET
Abs Immature Granulocytes: 0.02 10*3/uL (ref 0.00–0.07)
Basophils Absolute: 0 10*3/uL (ref 0.0–0.1)
Basophils Relative: 1 %
Eosinophils Absolute: 0.2 10*3/uL (ref 0.0–0.5)
Eosinophils Relative: 2 %
HCT: 43.1 % (ref 36.0–46.0)
Hemoglobin: 14.5 g/dL (ref 12.0–15.0)
Immature Granulocytes: 0 %
Lymphocytes Relative: 32 %
Lymphs Abs: 2.7 10*3/uL (ref 0.7–4.0)
MCH: 29.7 pg (ref 26.0–34.0)
MCHC: 33.6 g/dL (ref 30.0–36.0)
MCV: 88.3 fL (ref 80.0–100.0)
Monocytes Absolute: 0.5 10*3/uL (ref 0.1–1.0)
Monocytes Relative: 6 %
Neutro Abs: 5 10*3/uL (ref 1.7–7.7)
Neutrophils Relative %: 59 %
Platelets: 273 10*3/uL (ref 150–400)
RBC: 4.88 MIL/uL (ref 3.87–5.11)
RDW: 13.4 % (ref 11.5–15.5)
WBC: 8.5 10*3/uL (ref 4.0–10.5)
nRBC: 0 % (ref 0.0–0.2)

## 2022-07-13 LAB — COMPREHENSIVE METABOLIC PANEL
ALT: 17 U/L (ref 0–44)
AST: 13 U/L — ABNORMAL LOW (ref 15–41)
Albumin: 3.5 g/dL (ref 3.5–5.0)
Alkaline Phosphatase: 39 U/L (ref 38–126)
Anion gap: 11 (ref 5–15)
BUN: 9 mg/dL (ref 6–20)
CO2: 23 mmol/L (ref 22–32)
Calcium: 8.8 mg/dL — ABNORMAL LOW (ref 8.9–10.3)
Chloride: 103 mmol/L (ref 98–111)
Creatinine, Ser: 0.96 mg/dL (ref 0.44–1.00)
GFR, Estimated: >60 mL/min (ref >60)
Glucose, Bld: 144 mg/dL — ABNORMAL HIGH (ref 70–99)
Potassium: 3.6 mmol/L (ref 3.5–5.1)
Sodium: 137 mmol/L (ref 135–145)
Total Bilirubin: 0.4 mg/dL (ref 0.3–1.2)
Total Protein: 7.1 g/dL (ref 6.5–8.1)

## 2022-07-13 LAB — LIPASE, BLOOD: Lipase: 45 U/L (ref 11–51)

## 2022-07-13 MED ORDER — OXYCODONE-ACETAMINOPHEN 5-325 MG PO TABS
1.0000 | ORAL_TABLET | Freq: Once | ORAL | Status: AC
  Administered 2022-07-13: 1 via ORAL
  Filled 2022-07-13: qty 1

## 2022-07-13 MED ORDER — ONDANSETRON 4 MG PO TBDP
4.0000 mg | ORAL_TABLET | Freq: Once | ORAL | Status: AC
  Administered 2022-07-13: 4 mg via ORAL
  Filled 2022-07-13: qty 1

## 2022-07-13 NOTE — ED Triage Notes (Signed)
Pt. Stated, I started having pain this morning in my vagina and went to my back and I can't even touch it. Ive also had N/V . This started about a hour ago. I feel like I have to pee and poop but I dont.

## 2022-07-13 NOTE — ED Notes (Signed)
Pt handed writer their patient labels and stated they were "gonna go somewhere else".

## 2022-07-13 NOTE — ED Notes (Signed)
Unable to urinate

## 2022-07-13 NOTE — ED Provider Triage Note (Signed)
Emergency Medicine Provider Triage Evaluation Note  Sara Moore , a 37 y.o. female  was evaluated in triage.  Pt complains of right flank pain that radiates to her vagina. She reports that she started to have nausea and vomiting. She feels the urge to urinate, but reports that nothing comes out. She also started to have nausea and vomiting this AM. She denies any vaginal discharge, vaginal bleeding, dysuria, hematuria, or bowel changes. She reports some lower ride sided back pain as well. Atruamtatic. No incontinence, No IVDU, No saddle anesthesia.   Review of Systems  Positive:  Negative:   Physical Exam  BP 136/84 (BP Location: Right Arm)   Pulse 75   Temp 98.4 F (36.9 C)   Resp 20   Ht '5\' 9"'$  (1.753 m)   Wt 131.5 kg   SpO2 100%   BMI 42.83 kg/m  Gen:   Awake, no distress   Resp:  Normal effort  MSK:   Moves extremities without difficulty  Other:  Mild right lower paraspinal tenderness to palpation. The patient reports that the pain feels deeper. Ambulatory with steady gait.   Medical Decision Making  Medically screening exam initiated at 9:42 AM.  Appropriate orders placed.  Sara Moore was informed that the remainder of the evaluation will be completed by another provider, this initial triage assessment does not replace that evaluation, and the importance of remaining in the ED until their evaluation is complete.  CT and labs ordered.    Sherrell Puller, [place] 07/13/22 [phone]

## 2022-07-28 ENCOUNTER — Encounter: Payer: Self-pay | Admitting: Obstetrics and Gynecology

## 2022-07-28 ENCOUNTER — Other Ambulatory Visit (HOSPITAL_COMMUNITY)
Admission: RE | Admit: 2022-07-28 | Discharge: 2022-07-28 | Disposition: A | Discharge status: Home / Self Care | Payer: Medicaid Other | Source: Ambulatory Visit | Attending: Obstetrics and Gynecology | Admitting: Obstetrics and Gynecology

## 2022-07-28 ENCOUNTER — Ambulatory Visit (INDEPENDENT_AMBULATORY_CARE_PROVIDER_SITE_OTHER): Payer: Medicaid Other | Admitting: Obstetrics and Gynecology

## 2022-07-28 VITALS — BP 114/75 | HR 102

## 2022-07-28 DIAGNOSIS — R87613 High grade squamous intraepithelial lesion on cytologic smear of cervix (HGSIL): Secondary | ICD-10-CM | POA: Diagnosis not present

## 2022-07-28 DIAGNOSIS — Z3202 Encounter for pregnancy test, result negative: Secondary | ICD-10-CM

## 2022-07-28 HISTORY — PX: LEEP: PRO1013

## 2022-07-28 LAB — POCT URINE PREGNANCY: Preg Test, Ur: NEGATIVE

## 2022-07-28 NOTE — Progress Notes (Signed)
Patient presents for LEEP today.   UPT : Negative

## 2022-07-28 NOTE — Procedures (Signed)
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  The patient was placed in the dorsal lithotomy position. A coated Graves was speculum inserted in the vagina, and the cervix was visualized.  A colposcopy was performed with acetic acid and lugol's staining, with the below noted findings. The cervical stromal bed was injected with 106mL of 1% lidocaine with epinephrine. Entering at 12 o'clock on the cervix and using a large, shallow Fischer Loop and a setting of 50/50 blend current, a LEEP was done, in one circumferential fashion.  I did a top hat LEEP at 6 to 9 o'clock to ensure I got adequate tissue as it didn't seem deep enough because I "came off" due to it being close to the vaginal sidewall. Next, an ECC was done and hemostasis achieved with the ball electrode on 50 coagulation current and application of Monsel's. 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 She was advised to use OTC analgesics as needed for mild to moderate pain. Pelvic rest was advised until after her four week post operative visit.    Durene Romans MD Attending Center for [employer] [facility])

## 2022-08-04 LAB — SURGICAL PATHOLOGY

## 2022-08-25 ENCOUNTER — Other Ambulatory Visit (HOSPITAL_BASED_OUTPATIENT_CLINIC_OR_DEPARTMENT_OTHER): Payer: Self-pay

## 2022-08-25 ENCOUNTER — Other Ambulatory Visit (HOSPITAL_COMMUNITY): Payer: Self-pay

## 2022-08-25 MED ORDER — OZEMPIC (2 MG/DOSE) 8 MG/3ML ~~LOC~~ SOPN
2.0000 mg | PEN_INJECTOR | SUBCUTANEOUS | 3 refills | Status: DC
  Filled 2022-08-25: qty 3, 28d supply, fill #0

## 2022-08-31 ENCOUNTER — Encounter: Payer: Self-pay | Admitting: Obstetrics and Gynecology

## 2022-08-31 ENCOUNTER — Ambulatory Visit: Payer: Medicaid Other | Admitting: Obstetrics and Gynecology

## 2022-08-31 VITALS — BP 125/83 | HR 96 | Wt 281.0 lb

## 2022-08-31 DIAGNOSIS — N898 Other specified noninflammatory disorders of vagina: Secondary | ICD-10-CM | POA: Diagnosis not present

## 2022-08-31 DIAGNOSIS — Z9889 Other specified postprocedural states: Secondary | ICD-10-CM

## 2022-08-31 MED ORDER — FLUCONAZOLE 150 MG PO TABS: 150.0000 mg | ORAL_TABLET | Freq: Once | ORAL | 0 refills | Status: AC

## 2022-08-31 MED ORDER — METRONIDAZOLE 500 MG PO TABS: 500.0000 mg | ORAL_TABLET | Freq: Two times a day (BID) | ORAL | 0 refills | Status: DC

## 2022-08-31 NOTE — Progress Notes (Signed)
RGYN pt here for F/U LEEP.  CC: None

## 2022-08-31 NOTE — Progress Notes (Signed)
Obstetrics and Gynecology Visit Return Patient Evaluation  Appointment Date: 08/31/2022  Primary Care Provider: Parke Moore, Clinic  OBGYN Clinic: Center for [facility]  Chief Complaint: follow up LEEP  History of Present Illness:  Sara Moore is a 37 y.o. s/p 3/28 LEEP and ECC. Surg path showed focal CIN2 in background of CIN1 with negative margins and another specimen that was CIN 1 with neg margins and post LEEP ECC negative for CIN 2-3 on colpo biopsy and prior HSIL/HPV+ pap  Patient doing well. Still amenorrheic on aygestin and having some discharge.   Review of Systems: as noted in the History of Present Illness.   Patient Active Problem List   Diagnosis Date Noted   HSIL (high grade squamous intraepithelial lesion) on Pap smear of cervix 06/08/2022   Adenomyosis 09/12/2019   History of abnormal uterine bleeding 09/12/2019   BMI 40.0-44.9, adult (HCC) 05/10/2018   Abnormal uterine bleeding (AUB) 12/10/2015   Accelerated hypertension 03/04/2013   Dyslipidemia 03/04/2013   Morbid obesity (HCC) 03/04/2013   Medications:  Sara Moore had no medications administered during this visit. Current Outpatient Medications  Medication Sig Dispense Refill   amLODipine-benazepril (LOTREL) 5-10 MG capsule Take 1 capsule by mouth daily.     lisinopril (ZESTRIL) 20 MG tablet Take 20 mg by mouth daily.     norethindrone (AYGESTIN) 5 MG tablet Take 1 tablet (5 mg total) by mouth daily. 60 tablet 6   Semaglutide, 1 MG/DOSE, (OZEMPIC, 1 MG/DOSE,) 2 MG/1.5ML SOPN Inject 1 mg into the skin once a week.     Semaglutide, 1 MG/DOSE, (OZEMPIC, 1 MG/DOSE,) 4 MG/3ML SOPN Inject 1 mg into the skin once a week. 3 mL 4   Semaglutide, 1 MG/DOSE, (OZEMPIC, 1 MG/DOSE,) 4 MG/3ML SOPN Inject 1 mg into the skin once a week. 3 mL 4   Semaglutide, 2 MG/DOSE, (OZEMPIC, 2 MG/DOSE,) 8 MG/3ML SOPN Inject 2 mg into the skin once a week. 3 mL 3   Semaglutide, 2 MG/DOSE, (OZEMPIC, 2  MG/DOSE,) 8 MG/3ML SOPN Inject 2 mg into the skin once a week. 3 mL 3   atorvastatin (LIPITOR) 20 MG tablet Take 20 mg by mouth at bedtime. (Patient not taking: Reported on 06/30/2022)     No current facility-administered medications for this visit.   Allergies: has No Known Allergies.  Physical Exam:  BP 125/83   Pulse 96   Wt 281 lb (127.5 kg)   BMI 41.50 kg/m  Body mass index is 41.5 kg/m. General appearance: Well nourished, well developed female in no acute distress.  Abdomen: diffusely non tender to palpation, non distended Neuro/Psych:  Normal mood and affect.    Pelvic exam:  Cervical exam performed in the presence of a chaperone EGBUS: normal Vaginal vault: scant brown discharge and white cottage cheese like d/c Cervix:  well healed, looks normal, nttp. Os patent with cytobrush   Assessment: pt doing well  Plan:  1. Vaginal discharge Flagyl and diflucan sent in  2. H/O LEEP I told her I misread the pathology report and thought there was CIN1 at the margins but it looks like the margins for high and low grade were negative along with a negative ECC. Given this, I told her I recommend a pap and HPV test in one year.   Return in 1 year (on 08/31/2023) for in person.  No future appointments.  Cornelia Copa MD Attending Center for [employer] [facility])

## 2022-09-02 ENCOUNTER — Other Ambulatory Visit (HOSPITAL_BASED_OUTPATIENT_CLINIC_OR_DEPARTMENT_OTHER): Payer: Self-pay

## 2022-09-09 ENCOUNTER — Other Ambulatory Visit (HOSPITAL_BASED_OUTPATIENT_CLINIC_OR_DEPARTMENT_OTHER): Payer: Self-pay

## 2022-09-29 ENCOUNTER — Other Ambulatory Visit (HOSPITAL_BASED_OUTPATIENT_CLINIC_OR_DEPARTMENT_OTHER): Payer: Self-pay

## 2022-09-29 MED ORDER — SEMAGLUTIDE (2 MG/DOSE) 8 MG/3ML ~~LOC~~ SOPN
2.0000 mg | PEN_INJECTOR | SUBCUTANEOUS | 3 refills | Status: AC
  Filled 2022-09-29: qty 3, 28d supply, fill #0
  Filled 2022-10-31: qty 3, 28d supply, fill #1
  Filled 2022-11-29: qty 3, 28d supply, fill #2

## 2022-09-30 ENCOUNTER — Other Ambulatory Visit (HOSPITAL_BASED_OUTPATIENT_CLINIC_OR_DEPARTMENT_OTHER): Payer: Self-pay

## 2022-09-30 ENCOUNTER — Encounter (HOSPITAL_BASED_OUTPATIENT_CLINIC_OR_DEPARTMENT_OTHER): Payer: Self-pay

## 2022-10-05 ENCOUNTER — Other Ambulatory Visit (HOSPITAL_BASED_OUTPATIENT_CLINIC_OR_DEPARTMENT_OTHER): Payer: Self-pay

## 2022-10-18 ENCOUNTER — Other Ambulatory Visit: Payer: Self-pay | Admitting: *Deleted

## 2022-10-18 DIAGNOSIS — Z8742 Personal history of other diseases of the female genital tract: Secondary | ICD-10-CM

## 2022-10-18 DIAGNOSIS — Z01419 Encounter for gynecological examination (general) (routine) without abnormal findings: Secondary | ICD-10-CM

## 2022-10-18 MED ORDER — NORETHINDRONE ACETATE 5 MG PO TABS: 5.0000 mg | ORAL_TABLET | Freq: Every day | ORAL | 3 refills | Status: DC

## 2022-10-26 ENCOUNTER — Other Ambulatory Visit (HOSPITAL_BASED_OUTPATIENT_CLINIC_OR_DEPARTMENT_OTHER): Payer: Self-pay

## 2022-11-29 ENCOUNTER — Other Ambulatory Visit (HOSPITAL_BASED_OUTPATIENT_CLINIC_OR_DEPARTMENT_OTHER): Payer: Self-pay

## 2022-11-29 MED ORDER — OZEMPIC (2 MG/DOSE) 8 MG/3ML ~~LOC~~ SOPN
2.0000 mg | PEN_INJECTOR | SUBCUTANEOUS | 1 refills | Status: AC
  Filled 2022-11-29: qty 3, 28d supply, fill #0
  Filled 2022-12-23: qty 3, 28d supply, fill #1
  Filled 2023-01-23: qty 3, 28d supply, fill #2

## 2022-12-01 ENCOUNTER — Ambulatory Visit: Payer: Medicaid Other | Admitting: Obstetrics and Gynecology

## 2022-12-09 IMAGING — CT CT PELVIS W/ CM
2 of 3 series · 16 of 46 positions shown, 18 images · IV contrast (agent unspecified)
Comparison: None.

CLINICAL DATA: Pain in the anorectal region

EXAM:
CT PELVIS WITH CONTRAST
TECHNIQUE: Multidetector CT imaging of the pelvis was performed using the
standard protocol following the bolus administration of intravenous
contrast.

[Series 2: routine abd/pel with · axial · 0.98mm/px · z∈[-1638,-1368]mm · 13 of 64 slices shown, 15 images]
[im 5/64  soft-tissue]
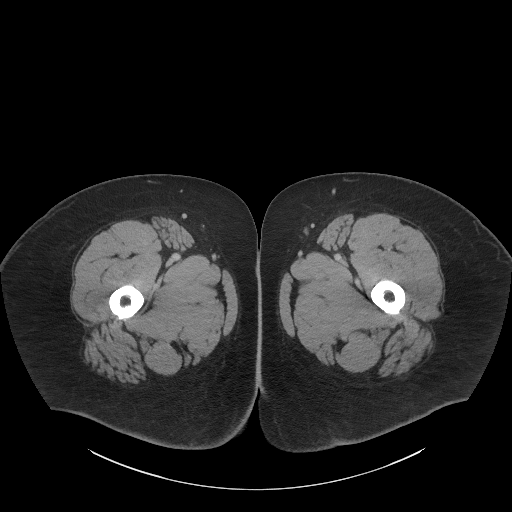
[im 5/64  bone]
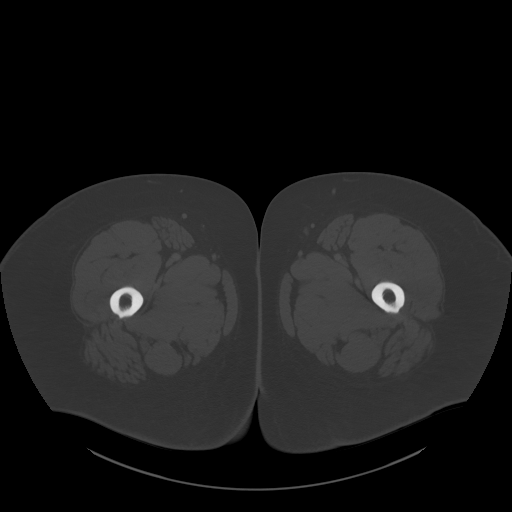
[im 9/64  soft-tissue]
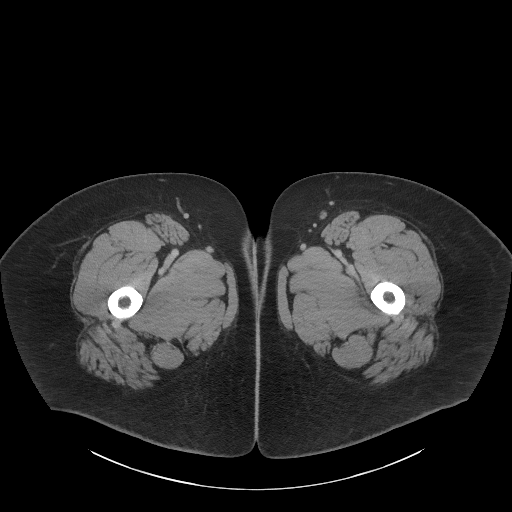
[im 13/64  soft-tissue]
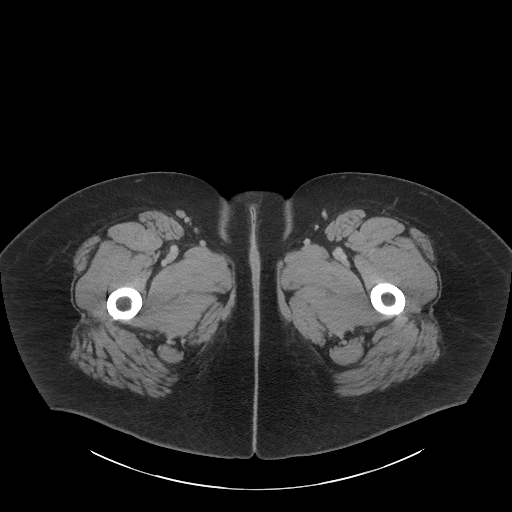
[im 19/64  soft-tissue]
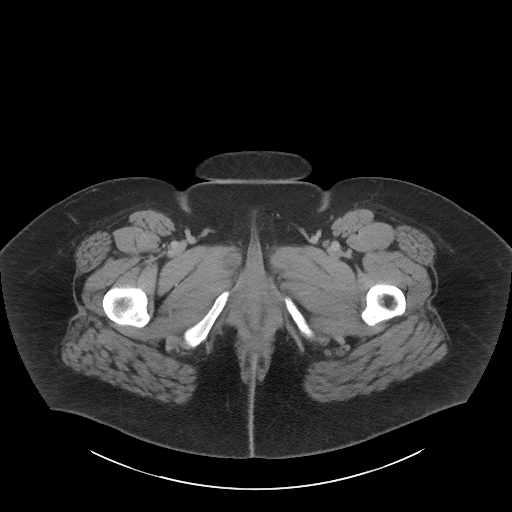
[im 23/64  soft-tissue]
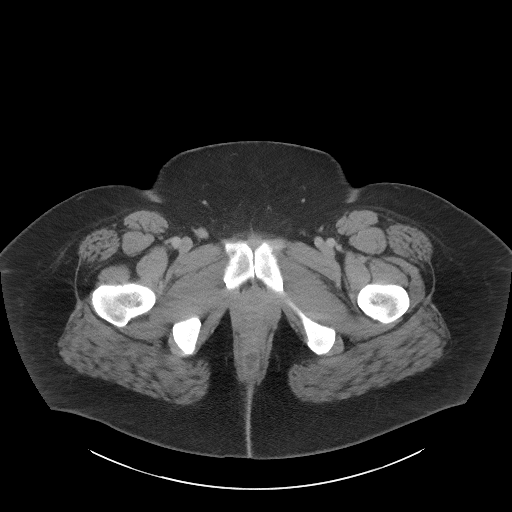
[im 27/64  soft-tissue]
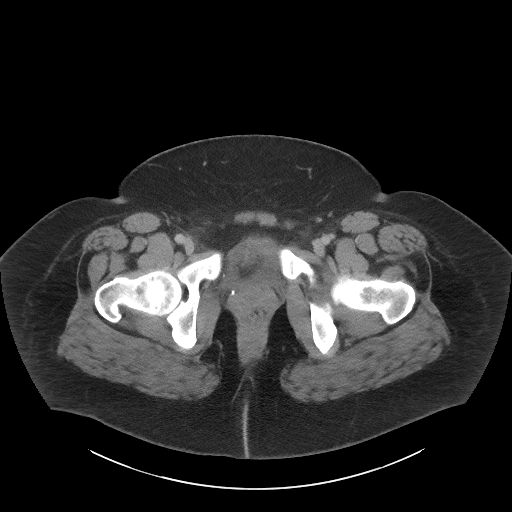
[im 33/64  soft-tissue]
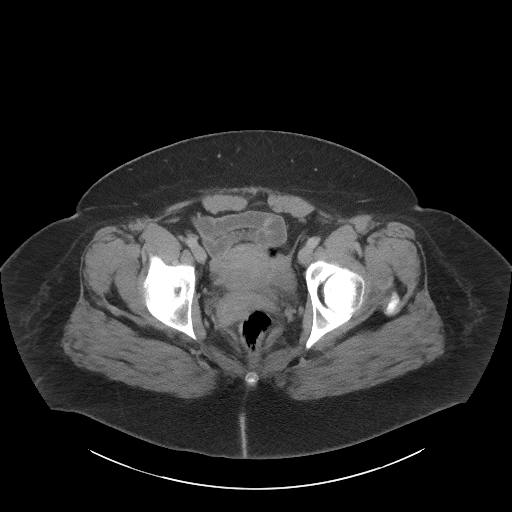
[im 37/64  soft-tissue]
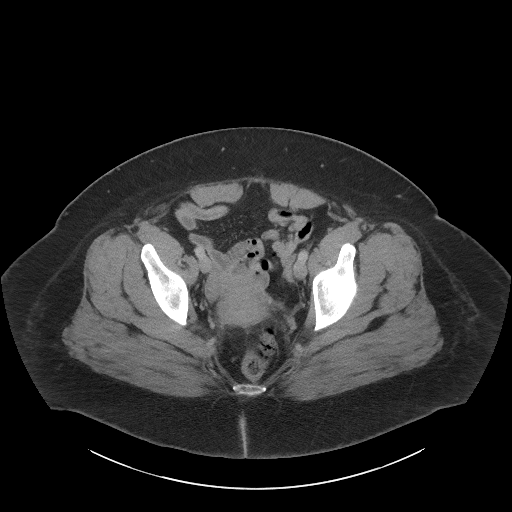
[im 41/64  soft-tissue]
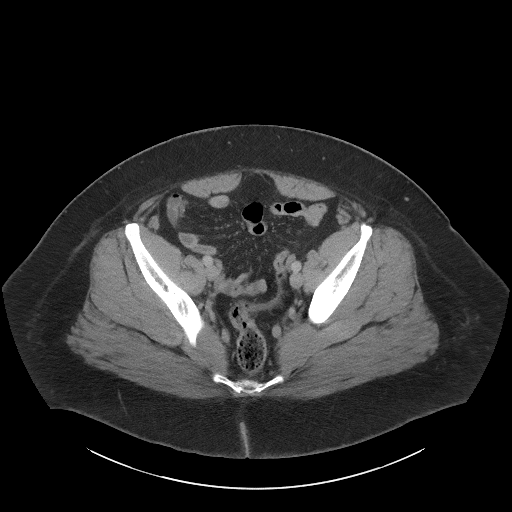
[im 41/64  bone]
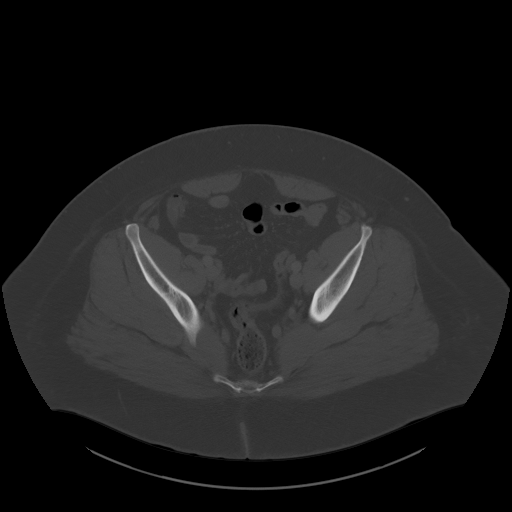
[im 45/64  soft-tissue]
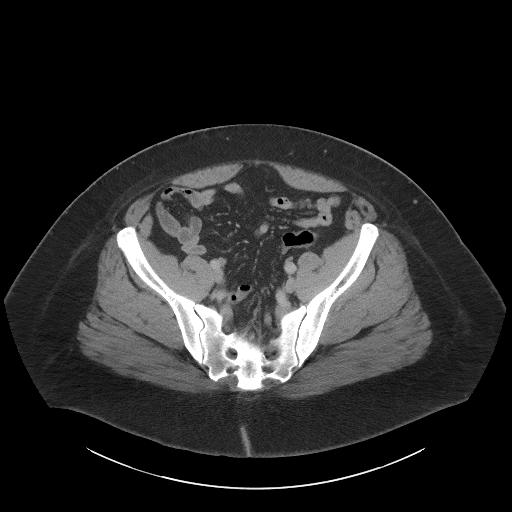
[im 51/64  soft-tissue]
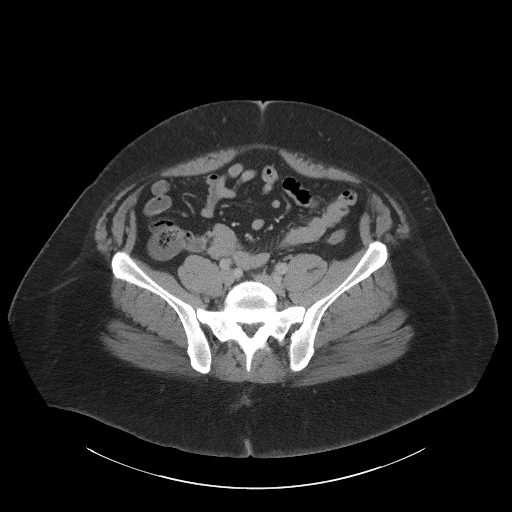
[im 55/64  soft-tissue]
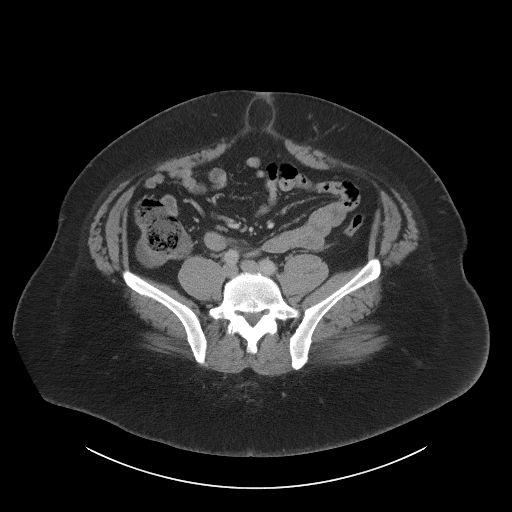
[im 59/64  soft-tissue]
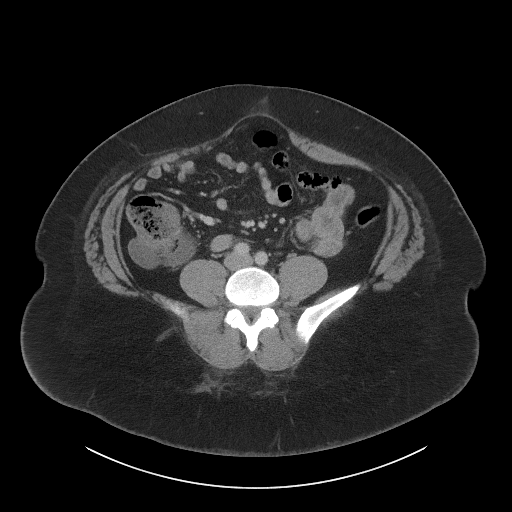

[Series 4: coronal st · coronal · 0.63mm/px · 3 of 124 slices shown]
[im 42/124  soft-tissue]
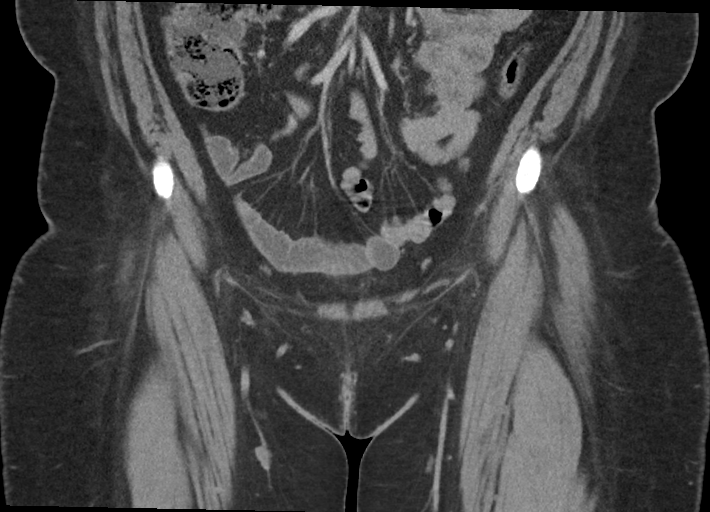
[im 55/124  soft-tissue]
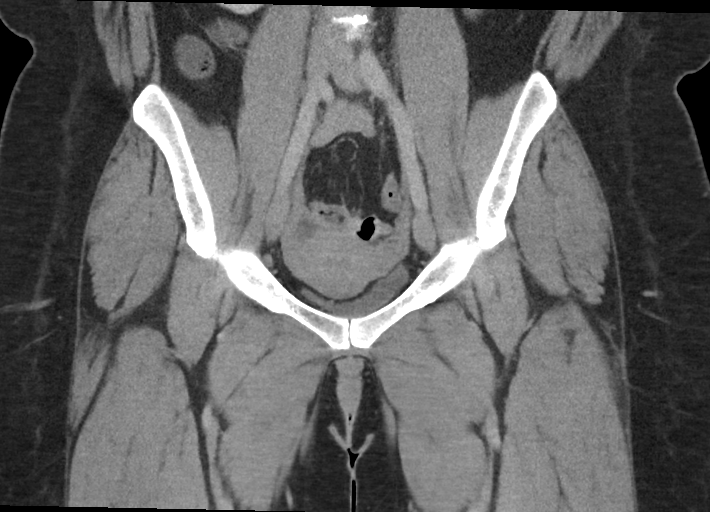
[im 69/124  soft-tissue]
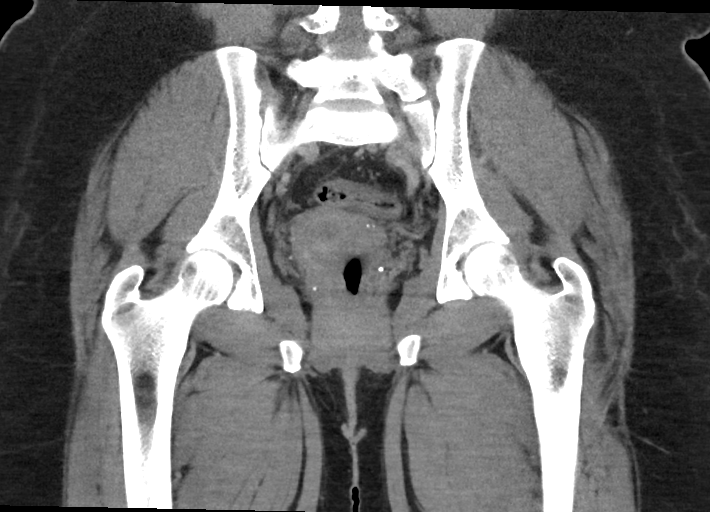

[16 of 46 positions shown; findings below may reference images not displayed]

RADIATION DOSE REDUCTION: This exam was performed according to the
departmental dose-optimization program which includes automated
exposure control, adjustment of the mA and/or kV according to
patient size and/or use of iterative reconstruction technique.

CONTRAST:  100mL OMNIPAQUE IOHEXOL 350 MG/ML SOLN
FINDINGS: There is no free fluid in the pelvis. Bowel loops in the pelvis are
unremarkable. There is slightly inhomogeneous attenuation in the
uterus. There are no dominant adnexal masses. There is no
significant wall thickening in the rectum. There is 3.3 x 1.5 cm
loculated thick-walled fluid collection posterior to the anus. There
is umbilical hernia containing fat. Bony structures are
unremarkable.

Musculoskeletal: Unremarkable.
IMPRESSION: There is a 3.3 x 1.5 cm thick-walled loculated fluid collection
posterior to the anus suggesting perianal abscess.

There is no significant wall thickening in the rectum. There is no
perirectal fluid collection in the pelvic cavity.

Umbilical hernia containing fat is seen.

## 2022-12-28 ENCOUNTER — Telehealth: Payer: Self-pay

## 2022-12-28 NOTE — Telephone Encounter (Signed)
Pt called in stating that her bleeding has started and she will have to take more pills that she requested yesterday to stop the bleeding. Notified pt message was sent yesterday to nurse and they have not had a chance to respond.

## 2022-12-30 ENCOUNTER — Other Ambulatory Visit: Payer: Self-pay | Admitting: *Deleted

## 2022-12-30 DIAGNOSIS — Z8742 Personal history of other diseases of the female genital tract: Secondary | ICD-10-CM

## 2022-12-30 DIAGNOSIS — Z01419 Encounter for gynecological examination (general) (routine) without abnormal findings: Secondary | ICD-10-CM

## 2022-12-30 MED ORDER — NORETHINDRONE ACETATE 5 MG PO TABS: 5.0000 mg | ORAL_TABLET | Freq: Every day | ORAL | 1 refills | Status: DC

## 2023-01-19 ENCOUNTER — Ambulatory Visit: Payer: Medicaid Other | Admitting: Obstetrics and Gynecology

## 2023-01-23 ENCOUNTER — Other Ambulatory Visit (HOSPITAL_BASED_OUTPATIENT_CLINIC_OR_DEPARTMENT_OTHER): Payer: Self-pay

## 2023-01-24 ENCOUNTER — Other Ambulatory Visit (HOSPITAL_BASED_OUTPATIENT_CLINIC_OR_DEPARTMENT_OTHER): Payer: Self-pay

## 2023-01-25 ENCOUNTER — Other Ambulatory Visit (HOSPITAL_BASED_OUTPATIENT_CLINIC_OR_DEPARTMENT_OTHER): Payer: Self-pay

## 2023-01-25 MED ORDER — SEMAGLUTIDE-WEIGHT MANAGEMENT 0.5 MG/0.5ML ~~LOC~~ SOAJ
0.5000 mg | SUBCUTANEOUS | 0 refills | Status: AC
  Filled 2023-01-25 – 2023-10-27 (×2): qty 2, 28d supply, fill #0

## 2023-01-30 ENCOUNTER — Other Ambulatory Visit (HOSPITAL_BASED_OUTPATIENT_CLINIC_OR_DEPARTMENT_OTHER): Payer: Self-pay

## 2023-01-31 ENCOUNTER — Other Ambulatory Visit (HOSPITAL_BASED_OUTPATIENT_CLINIC_OR_DEPARTMENT_OTHER): Payer: Self-pay

## 2023-01-31 MED ORDER — SEMAGLUTIDE-WEIGHT MANAGEMENT 0.5 MG/0.5ML ~~LOC~~ SOAJ
0.5000 mg | SUBCUTANEOUS | 0 refills | Status: AC
  Filled 2023-01-31 – 2023-04-18 (×2): qty 2, 28d supply, fill #0

## 2023-02-01 ENCOUNTER — Other Ambulatory Visit (HOSPITAL_BASED_OUTPATIENT_CLINIC_OR_DEPARTMENT_OTHER): Payer: Self-pay

## 2023-02-12 ENCOUNTER — Other Ambulatory Visit (HOSPITAL_BASED_OUTPATIENT_CLINIC_OR_DEPARTMENT_OTHER): Payer: Self-pay

## 2023-02-13 ENCOUNTER — Other Ambulatory Visit (HOSPITAL_BASED_OUTPATIENT_CLINIC_OR_DEPARTMENT_OTHER): Payer: Self-pay

## 2023-02-13 MED ORDER — SEMAGLUTIDE-WEIGHT MANAGEMENT 0.5 MG/0.5ML ~~LOC~~ SOAJ
0.5000 mg | SUBCUTANEOUS | 0 refills | Status: AC
  Filled 2023-02-13: qty 2, 28d supply, fill #0

## 2023-02-15 ENCOUNTER — Encounter: Payer: Self-pay | Admitting: Obstetrics and Gynecology

## 2023-02-15 ENCOUNTER — Ambulatory Visit: Payer: Medicaid Other | Admitting: Obstetrics and Gynecology

## 2023-02-15 ENCOUNTER — Other Ambulatory Visit (HOSPITAL_BASED_OUTPATIENT_CLINIC_OR_DEPARTMENT_OTHER): Payer: Self-pay

## 2023-02-15 DIAGNOSIS — N8003 Adenomyosis of the uterus: Secondary | ICD-10-CM

## 2023-02-15 DIAGNOSIS — Z8742 Personal history of other diseases of the female genital tract: Secondary | ICD-10-CM

## 2023-02-15 MED ORDER — NORETHINDRONE ACETATE 5 MG PO TABS: 5.0000 mg | ORAL_TABLET | Freq: Two times a day (BID) | ORAL | 11 refills | Status: DC

## 2023-02-15 NOTE — Progress Notes (Signed)
Obstetrics and Gynecology Visit Return Patient Evaluation  Appointment Date: 02/15/2023  Primary Care Provider: Parke Simmers, Clinic  OBGYN Clinic: [facility]  Chief Complaint: medication refill  History of Present Illness:  Sara Moore is a 37 y.o. currently on aygestin for her AUB and adenomyosis. Patient uses 5mg  bid and she is amenorrheic on it. She increased it to tid after the LEEP b/c she had some bleeding but is currently back to bid; pharmacy states she's not due for a refill  Review of Systems: as noted in the History of Present Illness.   Patient Active Problem List   Diagnosis Date Noted   HSIL (high grade squamous intraepithelial lesion) on Pap smear of cervix 06/08/2022   Adenomyosis 09/12/2019   History of abnormal uterine bleeding 09/12/2019   BMI 40.0-44.9, adult (HCC) 05/10/2018   Abnormal uterine bleeding (AUB) 12/10/2015   Accelerated hypertension 03/04/2013   Dyslipidemia 03/04/2013   Morbid obesity (HCC) 03/04/2013   Medications:  Masaye S. Aten had no medications administered during this visit. Current Outpatient Medications  Medication Sig Dispense Refill   amLODipine-benazepril (LOTREL) 5-10 MG capsule Take 1 capsule by mouth daily.     atorvastatin (LIPITOR) 20 MG tablet Take 20 mg by mouth at bedtime.     lisinopril (ZESTRIL) 20 MG tablet Take 20 mg by mouth daily.     norethindrone (AYGESTIN) 5 MG tablet Take 1 tablet (5 mg total) by mouth 2 (two) times daily. 60 tablet 11   No current facility-administered medications for this visit.    Allergies: has No Known Allergies.  Physical Exam:  BP 136/80   Pulse 78   Wt 267 lb (121.1 kg)   BMI 39.43 kg/m  Body mass index is 39.43 kg/m. General appearance: Well nourished, well developed female in no acute distress.  Neuro/Psych:  Normal mood and affect.    Assessment: patient stable  Plan:  1. Adenomyosis Refill sent in for patient.   Cornelia Copa MD Attending Center for [employer] [facility])

## 2023-02-15 NOTE — Progress Notes (Signed)
RGYN pt here to medication Management pt states she is having issues with getting Aygestin on time.   Wants to discuss if Rx can be adjusted   Pt states Rx is helping with bleeding.

## 2023-02-23 ENCOUNTER — Other Ambulatory Visit: Payer: Self-pay

## 2023-02-24 ENCOUNTER — Other Ambulatory Visit (HOSPITAL_BASED_OUTPATIENT_CLINIC_OR_DEPARTMENT_OTHER): Payer: Self-pay

## 2023-02-27 ENCOUNTER — Other Ambulatory Visit (HOSPITAL_BASED_OUTPATIENT_CLINIC_OR_DEPARTMENT_OTHER): Payer: Self-pay

## 2023-02-28 ENCOUNTER — Other Ambulatory Visit (HOSPITAL_BASED_OUTPATIENT_CLINIC_OR_DEPARTMENT_OTHER): Payer: Self-pay

## 2023-03-01 ENCOUNTER — Other Ambulatory Visit (HOSPITAL_BASED_OUTPATIENT_CLINIC_OR_DEPARTMENT_OTHER): Payer: Self-pay

## 2023-03-14 ENCOUNTER — Other Ambulatory Visit (HOSPITAL_BASED_OUTPATIENT_CLINIC_OR_DEPARTMENT_OTHER): Payer: Self-pay

## 2023-03-22 ENCOUNTER — Other Ambulatory Visit (HOSPITAL_BASED_OUTPATIENT_CLINIC_OR_DEPARTMENT_OTHER): Payer: Self-pay

## 2023-03-25 ENCOUNTER — Other Ambulatory Visit (HOSPITAL_BASED_OUTPATIENT_CLINIC_OR_DEPARTMENT_OTHER): Payer: Self-pay

## 2023-04-01 ENCOUNTER — Other Ambulatory Visit (HOSPITAL_BASED_OUTPATIENT_CLINIC_OR_DEPARTMENT_OTHER): Payer: Self-pay

## 2023-04-07 ENCOUNTER — Other Ambulatory Visit (HOSPITAL_BASED_OUTPATIENT_CLINIC_OR_DEPARTMENT_OTHER): Payer: Self-pay

## 2023-04-18 ENCOUNTER — Other Ambulatory Visit (HOSPITAL_BASED_OUTPATIENT_CLINIC_OR_DEPARTMENT_OTHER): Payer: Self-pay

## 2023-05-09 ENCOUNTER — Other Ambulatory Visit (HOSPITAL_BASED_OUTPATIENT_CLINIC_OR_DEPARTMENT_OTHER): Payer: Self-pay

## 2023-05-09 MED ORDER — WEGOVY 0.5 MG/0.5ML ~~LOC~~ SOAJ
0.5000 mg | SUBCUTANEOUS | 0 refills | Status: AC
  Filled 2023-05-09 – 2023-05-19 (×3): qty 2, 28d supply, fill #0

## 2023-05-19 ENCOUNTER — Other Ambulatory Visit (HOSPITAL_BASED_OUTPATIENT_CLINIC_OR_DEPARTMENT_OTHER): Payer: Self-pay

## 2023-06-07 ENCOUNTER — Other Ambulatory Visit (HOSPITAL_BASED_OUTPATIENT_CLINIC_OR_DEPARTMENT_OTHER): Payer: Self-pay

## 2023-06-07 MED ORDER — WEGOVY 1 MG/0.5ML ~~LOC~~ SOAJ
1.0000 mg | SUBCUTANEOUS | 1 refills | Status: AC
  Filled 2023-06-07: qty 2, 28d supply, fill #0
  Filled 2023-07-10: qty 2, 28d supply, fill #1

## 2023-08-01 ENCOUNTER — Other Ambulatory Visit (HOSPITAL_BASED_OUTPATIENT_CLINIC_OR_DEPARTMENT_OTHER): Payer: Self-pay

## 2023-08-01 MED ORDER — WEGOVY 1.7 MG/0.75ML ~~LOC~~ SOAJ
1.7000 mg | SUBCUTANEOUS | 0 refills | Status: AC
  Filled 2023-08-01: qty 3, 28d supply, fill #0

## 2023-09-04 ENCOUNTER — Other Ambulatory Visit (HOSPITAL_BASED_OUTPATIENT_CLINIC_OR_DEPARTMENT_OTHER): Payer: Self-pay

## 2023-09-04 MED ORDER — WEGOVY 2.4 MG/0.75ML ~~LOC~~ SOAJ
2.4000 mg | SUBCUTANEOUS | 0 refills | Status: DC
  Filled 2023-09-04: qty 9, 84d supply, fill #0
  Filled 2023-09-04: qty 3, 28d supply, fill #0
  Filled 2023-10-02 – 2023-10-03 (×2): qty 3, 28d supply, fill #1
  Filled 2023-10-26 – 2023-10-30 (×3): qty 3, 28d supply, fill #2
  Filled ????-??-??: fill #2

## 2023-09-07 ENCOUNTER — Other Ambulatory Visit (HOSPITAL_BASED_OUTPATIENT_CLINIC_OR_DEPARTMENT_OTHER): Payer: Self-pay

## 2023-09-13 ENCOUNTER — Other Ambulatory Visit (HOSPITAL_BASED_OUTPATIENT_CLINIC_OR_DEPARTMENT_OTHER): Payer: Self-pay

## 2023-10-03 ENCOUNTER — Other Ambulatory Visit (HOSPITAL_BASED_OUTPATIENT_CLINIC_OR_DEPARTMENT_OTHER): Payer: Self-pay

## 2023-10-26 ENCOUNTER — Other Ambulatory Visit (HOSPITAL_BASED_OUTPATIENT_CLINIC_OR_DEPARTMENT_OTHER): Payer: Self-pay

## 2023-10-27 ENCOUNTER — Other Ambulatory Visit (HOSPITAL_BASED_OUTPATIENT_CLINIC_OR_DEPARTMENT_OTHER): Payer: Self-pay

## 2023-10-27 ENCOUNTER — Other Ambulatory Visit: Payer: Self-pay

## 2023-11-27 ENCOUNTER — Other Ambulatory Visit (HOSPITAL_BASED_OUTPATIENT_CLINIC_OR_DEPARTMENT_OTHER): Payer: Self-pay

## 2023-11-27 MED ORDER — SEMAGLUTIDE-WEIGHT MANAGEMENT 2.4 MG/0.75ML ~~LOC~~ SOAJ
2.4000 mg | SUBCUTANEOUS | 0 refills | Status: AC
  Filled 2023-11-27: qty 3, 28d supply, fill #0
  Filled 2023-12-25: qty 3, 28d supply, fill #1
  Filled 2024-01-22: qty 3, 28d supply, fill #2

## 2023-11-28 ENCOUNTER — Other Ambulatory Visit (HOSPITAL_BASED_OUTPATIENT_CLINIC_OR_DEPARTMENT_OTHER): Payer: Self-pay

## 2023-12-04 ENCOUNTER — Other Ambulatory Visit (HOSPITAL_BASED_OUTPATIENT_CLINIC_OR_DEPARTMENT_OTHER): Payer: Self-pay

## 2024-01-02 ENCOUNTER — Other Ambulatory Visit (HOSPITAL_BASED_OUTPATIENT_CLINIC_OR_DEPARTMENT_OTHER): Payer: Self-pay

## 2024-01-02 MED ORDER — WEGOVY 2.4 MG/0.75ML ~~LOC~~ SOAJ
2.4000 mg | SUBCUTANEOUS | 0 refills | Status: AC
  Filled 2024-01-02 – 2024-02-05 (×2): qty 3, 28d supply, fill #0

## 2024-01-22 ENCOUNTER — Other Ambulatory Visit (HOSPITAL_BASED_OUTPATIENT_CLINIC_OR_DEPARTMENT_OTHER): Payer: Self-pay

## 2024-01-29 ENCOUNTER — Other Ambulatory Visit (HOSPITAL_BASED_OUTPATIENT_CLINIC_OR_DEPARTMENT_OTHER): Payer: Self-pay

## 2024-02-05 ENCOUNTER — Other Ambulatory Visit: Payer: Self-pay

## 2024-02-05 ENCOUNTER — Other Ambulatory Visit (HOSPITAL_BASED_OUTPATIENT_CLINIC_OR_DEPARTMENT_OTHER): Payer: Self-pay

## 2024-02-21 ENCOUNTER — Other Ambulatory Visit: Payer: Self-pay | Admitting: Obstetrics and Gynecology

## 2024-04-26 ENCOUNTER — Other Ambulatory Visit (HOSPITAL_COMMUNITY)
Admission: RE | Admit: 2024-04-26 | Discharge: 2024-04-26 | Disposition: A | Discharge status: Home / Self Care | Source: Ambulatory Visit | Attending: Obstetrics and Gynecology | Admitting: Obstetrics and Gynecology

## 2024-04-26 ENCOUNTER — Ambulatory Visit (INDEPENDENT_AMBULATORY_CARE_PROVIDER_SITE_OTHER): Admitting: Obstetrics and Gynecology

## 2024-04-26 ENCOUNTER — Encounter: Payer: Self-pay | Admitting: Obstetrics and Gynecology

## 2024-04-26 VITALS — BP 134/83 | HR 78 | Wt 251.0 lb

## 2024-04-26 DIAGNOSIS — Z87410 Personal history of cervical dysplasia: Secondary | ICD-10-CM | POA: Diagnosis present

## 2024-04-26 DIAGNOSIS — N8003 Adenomyosis of the uterus: Secondary | ICD-10-CM

## 2024-04-26 NOTE — Progress Notes (Signed)
 Obstetrics and Gynecology Visit Return Patient Evaluation  Appointment Date: 04/26/2024  OBGYN Clinic: [facility] for [facility]   Chief Complaint: surveillance pap smear  History of Present Illness:  Sara Moore is a 38 y.o. with history of 3/24 LEEP with focal CIN2, CIN1 background with negative margins and negative ECC with history of CIN 2-3 on colpo biopsy. She is also on aygestin  for h/o adenomyosis and AUB. She continues on the bid aygestin  and is amenorrheic on it.  She has no GYN questions or complaints today.   Review of Systems: as noted in the History of Present Illness.  Patient Active Problem List   Diagnosis Date Noted   HSIL (high grade squamous intraepithelial lesion) on Pap smear of cervix 06/08/2022   Adenomyosis 09/12/2019   History of abnormal uterine bleeding 09/12/2019   BMI 40.0-44.9, adult (HCC) 05/10/2018   Abnormal uterine bleeding (AUB) 12/10/2015   Accelerated hypertension 03/04/2013   Dyslipidemia 03/04/2013   Morbid obesity (HCC) 03/04/2013   Medications:  Hollace S. Pascoe had no medications administered during this visit. Current Outpatient Medications  Medication Sig Dispense Refill   amLODipine-benazepril (LOTREL) 5-10 MG capsule Take 1 capsule by mouth daily.     atorvastatin (LIPITOR) 20 MG tablet Take 20 mg by mouth at bedtime.     lisinopril (ZESTRIL) 20 MG tablet Take 20 mg by mouth daily.     norethindrone  (AYGESTIN ) 5 MG tablet TAKE 1 TABLET BY MOUTH TWICE A DAY (Patient not taking: Reported on 04/26/2024) 180 tablet 0   Semaglutide , 1 MG/DOSE, (OZEMPIC , 1 MG/DOSE,) 2 MG/1.5ML SOPN Inject 1 mg into the skin once a week. (Patient not taking: Reported on 02/15/2023)     Semaglutide , 1 MG/DOSE, (OZEMPIC , 1 MG/DOSE,) 4 MG/3ML SOPN Inject 1 mg into the skin once a week. 3 mL 4   Semaglutide , 1 MG/DOSE, (OZEMPIC , 1 MG/DOSE,) 4 MG/3ML SOPN Inject 1 mg into the skin once a week. 3 mL 4   Semaglutide , 2 MG/DOSE, (OZEMPIC ,  2 MG/DOSE,) 8 MG/3ML SOPN Inject 2 mg into the skin once a week. (Patient not taking: Reported on 02/15/2023) 3 mL 3   Semaglutide , 2 MG/DOSE, (OZEMPIC , 2 MG/DOSE,) 8 MG/3ML SOPN Inject 2 mg into the skin once a week. (Patient not taking: Reported on 02/15/2023) 9 mL 1   Semaglutide -Weight Management (WEGOVY ) 0.5 MG/0.5ML SOAJ Inject 0.5 mg into the skin once a week. 2 mL 0   Semaglutide -Weight Management (WEGOVY ) 1 MG/0.5ML SOAJ Inject 1 mg into the skin once a week. 2 mL 1   Semaglutide -Weight Management (WEGOVY ) 1.7 MG/0.75ML SOAJ Inject 1.7 mg into the skin once a week. 3 mL 0   semaglutide -weight management (WEGOVY ) 2.4 MG/0.75ML SOAJ SQ injection Inject 2.4 mg into the skin every 7 (seven) days. (Patient not taking: Reported on 04/26/2024) 3 mL 0   Semaglutide -Weight Management (WEGOVY ) 2.4 MG/0.75ML SOAJ Inject 2.4 mg into the skin once a week. (Patient not taking: Reported on 04/26/2024) 9 mL 0   Semaglutide -Weight Management 0.5 MG/0.5ML SOAJ Inject 0.5 mg into the skin once a week. (Patient not taking: Reported on 02/15/2023) 2 mL 0   Semaglutide -Weight Management 0.5 MG/0.5ML SOAJ Inject 0.5 mg into the skin once a week. (Patient not taking: Reported on 02/15/2023) 2 mL 0   Semaglutide -Weight Management 0.5 MG/0.5ML SOAJ Inject 0.5 mg into the skin once a week. (Patient not taking: Reported on 02/15/2023) 2 mL 0   No current facility-administered medications for this visit.    Allergies: has  no known allergies.  Physical Exam:  BP 134/83   Pulse 78   Wt 251 lb (113.9 kg)   LMP  (LMP Unknown)   BMI 37.07 kg/m  Body mass index is 37.07 kg/m. General appearance: Well nourished, well developed female in no acute distress.  Abdomen: diffusely non tender to palpation, non distended, and no masses, hernias Neuro/Psych:  Normal mood and affect.    Pelvic exam:  Cervical exam performed in the presence of a chaperone EGBUS: wnl Vaginal vault: wnl Cervix:  wnl Bimanual:  negative   Assessment: patient doing well  Plan:  1. History of cervical dysplasia (Primary) Follow up pap and hpv. Pt declines STD screening - Cytology - PAP  2. Adenomyosis D/w her re: bid aygestin  and risk of HTN, dyslipidemia, VTE and pt would like to continue. She is not a smoker. I told her if she would like to discuss other medical or surgical options to let me know and that we may need to change therapy options given higher risks at age 47 and age 10 of above issues.    RTC: 1 year and PRN  Bebe Izell Raddle MD Attending Center for [employer] [facility])

## 2024-04-30 LAB — CYTOLOGY - PAP
Comment: NEGATIVE
Diagnosis: NEGATIVE
High risk HPV: NEGATIVE

## 2024-05-06 ENCOUNTER — Ambulatory Visit: Payer: Self-pay | Admitting: Obstetrics and Gynecology

## 2024-05-06 DIAGNOSIS — R87613 High grade squamous intraepithelial lesion on cytologic smear of cervix (HGSIL): Secondary | ICD-10-CM

## 2024-05-15 ENCOUNTER — Other Ambulatory Visit: Payer: Self-pay | Admitting: *Deleted

## 2024-05-15 MED ORDER — NORETHINDRONE ACETATE 5 MG PO TABS: 5.0000 mg | ORAL_TABLET | Freq: Two times a day (BID) | ORAL | 0 refills | Status: AC

## 2024-06-05 ENCOUNTER — Other Ambulatory Visit (HOSPITAL_BASED_OUTPATIENT_CLINIC_OR_DEPARTMENT_OTHER): Payer: Self-pay

## 2024-06-05 MED ORDER — WEGOVY 0.5 MG/0.5ML ~~LOC~~ SOAJ
0.5000 mg | SUBCUTANEOUS | 0 refills | Status: AC
  Filled 2024-06-05: qty 2, 28d supply, fill #0

## 2024-06-06 ENCOUNTER — Other Ambulatory Visit (HOSPITAL_BASED_OUTPATIENT_CLINIC_OR_DEPARTMENT_OTHER): Payer: Self-pay
